# Patient Record
Sex: Female | Born: 1935
Health system: Southern US, Community
[De-identification: ages and names within clinical notes are randomized; demographics above are authoritative.]

## PROBLEM LIST (undated history)

## (undated) DIAGNOSIS — E1121 Type 2 diabetes mellitus with diabetic nephropathy: Secondary | ICD-10-CM

## (undated) DIAGNOSIS — M159 Polyosteoarthritis, unspecified: Secondary | ICD-10-CM

## (undated) DIAGNOSIS — F419 Anxiety disorder, unspecified: Secondary | ICD-10-CM

## (undated) DIAGNOSIS — M545 Low back pain, unspecified: Secondary | ICD-10-CM

## (undated) DIAGNOSIS — E669 Obesity, unspecified: Secondary | ICD-10-CM

## (undated) DIAGNOSIS — F329 Major depressive disorder, single episode, unspecified: Secondary | ICD-10-CM

## (undated) DIAGNOSIS — E782 Mixed hyperlipidemia: Secondary | ICD-10-CM

## (undated) DIAGNOSIS — K219 Gastro-esophageal reflux disease without esophagitis: Secondary | ICD-10-CM

## (undated) DIAGNOSIS — N183 Chronic kidney disease, stage 3 unspecified: Secondary | ICD-10-CM

## (undated) DIAGNOSIS — M25562 Pain in left knee: Secondary | ICD-10-CM

## (undated) DIAGNOSIS — I129 Hypertensive chronic kidney disease with stage 1 through stage 4 chronic kidney disease, or unspecified chronic kidney disease: Secondary | ICD-10-CM

## (undated) HISTORY — DX: Hypertensive chronic kidney disease with stage 1 through stage 4 chronic kidney disease, or unspecified chronic kidney disease: I12.9

## (undated) HISTORY — DX: Pain in left knee: M25.562

## (undated) HISTORY — DX: Gastro-esophageal reflux disease without esophagitis: K21.9

## (undated) HISTORY — PX: CATARACT EXTRACTION: SUR2

## (undated) HISTORY — PX: KNEE SURGERY: SHX244

## (undated) HISTORY — DX: Major depressive disorder, single episode, unspecified: F32.9

## (undated) HISTORY — DX: Chronic kidney disease, stage 3 unspecified: N18.30

## (undated) HISTORY — DX: Mixed hyperlipidemia: E78.2

## (undated) HISTORY — PX: CHOLECYSTECTOMY: SHX55

## (undated) HISTORY — DX: Low back pain, unspecified: M54.50

## (undated) HISTORY — DX: Polyosteoarthritis, unspecified: M15.9

## (undated) HISTORY — DX: Obesity, unspecified: E66.9

## (undated) HISTORY — DX: Type 2 diabetes mellitus with diabetic nephropathy: E11.21

## (undated) HISTORY — PX: OTHER SURGICAL HISTORY: SHX169

## (undated) HISTORY — DX: Anxiety disorder, unspecified: F41.9

---
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
## 2000-12-18 ENCOUNTER — Encounter: Payer: Self-pay | Admitting: Podiatry

## 2000-12-18 ENCOUNTER — Ambulatory Visit (HOSPITAL_COMMUNITY): Admission: RE | Admit: 2000-12-18 | Discharge: 2000-12-18 | Payer: Self-pay | Admitting: Podiatry

## 2001-01-04 ENCOUNTER — Ambulatory Visit (HOSPITAL_COMMUNITY): Admission: RE | Admit: 2001-01-04 | Discharge: 2001-01-04 | Payer: Self-pay | Admitting: Podiatry

## 2001-01-04 ENCOUNTER — Encounter: Payer: Self-pay | Admitting: Podiatry

## 2001-02-25 ENCOUNTER — Encounter: Payer: Self-pay | Admitting: Podiatry

## 2001-03-01 ENCOUNTER — Encounter: Payer: Self-pay | Admitting: Podiatry

## 2001-03-01 ENCOUNTER — Ambulatory Visit (HOSPITAL_COMMUNITY): Admission: RE | Admit: 2001-03-01 | Discharge: 2001-03-01 | Payer: Self-pay | Admitting: Podiatry

## 2010-05-16 ENCOUNTER — Ambulatory Visit (HOSPITAL_COMMUNITY): Payer: Self-pay | Admitting: Pulmonary Disease

## 2010-05-16 ENCOUNTER — Encounter (HOSPITAL_COMMUNITY)
Admission: RE | Admit: 2010-05-16 | Discharge: 2010-06-15 | Payer: Self-pay | Source: Home / Self Care | Attending: Pulmonary Disease | Admitting: Pulmonary Disease

## 2010-05-24 ENCOUNTER — Inpatient Hospital Stay (HOSPITAL_COMMUNITY)
Admission: AD | Admit: 2010-05-24 | Discharge: 2010-05-27 | Payer: Self-pay | Source: Home / Self Care | Attending: Pulmonary Disease | Admitting: Pulmonary Disease

## 2010-08-12 LAB — DIFFERENTIAL
Basophils Absolute: 0 10*3/uL (ref 0.0–0.1)
Eosinophils Relative: 7 % — ABNORMAL HIGH (ref 0–5)
Lymphocytes Relative: 11 % — ABNORMAL LOW (ref 12–46)
Lymphs Abs: 1 10*3/uL (ref 0.7–4.0)
Monocytes Absolute: 0.8 10*3/uL (ref 0.1–1.0)
Neutro Abs: 7.2 10*3/uL (ref 1.7–7.7)

## 2010-08-12 LAB — CBC
HCT: 33.6 % — ABNORMAL LOW (ref 36.0–46.0)
Hemoglobin: 11.4 g/dL — ABNORMAL LOW (ref 12.0–15.0)
MCH: 29.9 pg (ref 26.0–34.0)
MCHC: 33.9 g/dL (ref 30.0–36.0)
MCV: 88.2 fL (ref 78.0–100.0)
Platelets: 237 10*3/uL (ref 150–400)
RBC: 3.81 MIL/uL — ABNORMAL LOW (ref 3.87–5.11)
RDW: 13.6 % (ref 11.5–15.5)
WBC: 9.6 10*3/uL (ref 4.0–10.5)

## 2010-08-12 LAB — COMPREHENSIVE METABOLIC PANEL
AST: 18 U/L (ref 0–37)
Albumin: 3.1 g/dL — ABNORMAL LOW (ref 3.5–5.2)
BUN: 29 mg/dL — ABNORMAL HIGH (ref 6–23)
Calcium: 8.8 mg/dL (ref 8.4–10.5)
Chloride: 100 mEq/L (ref 96–112)
Creatinine, Ser: 1.9 mg/dL — ABNORMAL HIGH (ref 0.4–1.2)
GFR calc Af Amer: 31 mL/min — ABNORMAL LOW (ref >60)
Total Bilirubin: 0.4 mg/dL (ref 0.3–1.2)

## 2010-08-12 LAB — URINE CULTURE
Colony Count: NO GROWTH
Culture  Setup Time: 201112242055
Culture: NO GROWTH
Special Requests: POSITIVE

## 2010-08-12 LAB — GLUCOSE, CAPILLARY
Glucose-Capillary: 104 mg/dL — ABNORMAL HIGH (ref 70–99)
Glucose-Capillary: 111 mg/dL — ABNORMAL HIGH (ref 70–99)
Glucose-Capillary: 123 mg/dL — ABNORMAL HIGH (ref 70–99)
Glucose-Capillary: 127 mg/dL — ABNORMAL HIGH (ref 70–99)
Glucose-Capillary: 131 mg/dL — ABNORMAL HIGH (ref 70–99)
Glucose-Capillary: 170 mg/dL — ABNORMAL HIGH (ref 70–99)
Glucose-Capillary: 87 mg/dL (ref 70–99)

## 2010-08-12 LAB — CULTURE, BLOOD (ROUTINE X 2): Culture: NO GROWTH

## 2012-10-18 ENCOUNTER — Encounter: Payer: Self-pay | Admitting: Family Medicine

## 2014-06-01 LAB — HM DEXA SCAN: HM Dexa Scan: NORMAL

## 2015-07-25 DIAGNOSIS — E1165 Type 2 diabetes mellitus with hyperglycemia: Secondary | ICD-10-CM | POA: Diagnosis not present

## 2015-08-23 DIAGNOSIS — E669 Obesity, unspecified: Secondary | ICD-10-CM | POA: Diagnosis not present

## 2015-08-23 DIAGNOSIS — N189 Chronic kidney disease, unspecified: Secondary | ICD-10-CM | POA: Diagnosis not present

## 2015-08-23 DIAGNOSIS — E1121 Type 2 diabetes mellitus with diabetic nephropathy: Secondary | ICD-10-CM | POA: Diagnosis not present

## 2015-08-28 DIAGNOSIS — K219 Gastro-esophageal reflux disease without esophagitis: Secondary | ICD-10-CM | POA: Diagnosis not present

## 2015-08-28 DIAGNOSIS — E1121 Type 2 diabetes mellitus with diabetic nephropathy: Secondary | ICD-10-CM | POA: Diagnosis not present

## 2015-08-28 DIAGNOSIS — I129 Hypertensive chronic kidney disease with stage 1 through stage 4 chronic kidney disease, or unspecified chronic kidney disease: Secondary | ICD-10-CM | POA: Diagnosis not present

## 2015-08-28 DIAGNOSIS — M545 Low back pain: Secondary | ICD-10-CM | POA: Diagnosis not present

## 2015-09-28 DIAGNOSIS — E559 Vitamin D deficiency, unspecified: Secondary | ICD-10-CM | POA: Diagnosis not present

## 2015-09-28 DIAGNOSIS — N183 Chronic kidney disease, stage 3 (moderate): Secondary | ICD-10-CM | POA: Diagnosis not present

## 2015-09-28 DIAGNOSIS — Z79899 Other long term (current) drug therapy: Secondary | ICD-10-CM | POA: Diagnosis not present

## 2015-09-28 DIAGNOSIS — D649 Anemia, unspecified: Secondary | ICD-10-CM | POA: Diagnosis not present

## 2015-09-28 DIAGNOSIS — I129 Hypertensive chronic kidney disease with stage 1 through stage 4 chronic kidney disease, or unspecified chronic kidney disease: Secondary | ICD-10-CM | POA: Diagnosis not present

## 2015-09-28 DIAGNOSIS — R809 Proteinuria, unspecified: Secondary | ICD-10-CM | POA: Diagnosis not present

## 2015-10-02 DIAGNOSIS — I10 Essential (primary) hypertension: Secondary | ICD-10-CM | POA: Diagnosis not present

## 2015-10-02 DIAGNOSIS — R809 Proteinuria, unspecified: Secondary | ICD-10-CM | POA: Diagnosis not present

## 2015-10-02 DIAGNOSIS — E1129 Type 2 diabetes mellitus with other diabetic kidney complication: Secondary | ICD-10-CM | POA: Diagnosis not present

## 2015-10-02 DIAGNOSIS — N184 Chronic kidney disease, stage 4 (severe): Secondary | ICD-10-CM | POA: Diagnosis not present

## 2015-11-07 DIAGNOSIS — E1165 Type 2 diabetes mellitus with hyperglycemia: Secondary | ICD-10-CM | POA: Diagnosis not present

## 2015-12-24 DIAGNOSIS — E1121 Type 2 diabetes mellitus with diabetic nephropathy: Secondary | ICD-10-CM | POA: Diagnosis not present

## 2015-12-24 DIAGNOSIS — I129 Hypertensive chronic kidney disease with stage 1 through stage 4 chronic kidney disease, or unspecified chronic kidney disease: Secondary | ICD-10-CM | POA: Diagnosis not present

## 2015-12-24 DIAGNOSIS — M545 Low back pain: Secondary | ICD-10-CM | POA: Diagnosis not present

## 2015-12-24 DIAGNOSIS — K219 Gastro-esophageal reflux disease without esophagitis: Secondary | ICD-10-CM | POA: Diagnosis not present

## 2015-12-27 ENCOUNTER — Other Ambulatory Visit (HOSPITAL_COMMUNITY): Payer: Self-pay | Admitting: Pulmonary Disease

## 2015-12-27 ENCOUNTER — Ambulatory Visit (HOSPITAL_COMMUNITY)
Admission: RE | Admit: 2015-12-27 | Discharge: 2015-12-27 | Disposition: A | Discharge status: Home / Self Care | Payer: Medicare HMO | Source: Ambulatory Visit | Attending: Pulmonary Disease | Admitting: Pulmonary Disease

## 2015-12-27 DIAGNOSIS — E1121 Type 2 diabetes mellitus with diabetic nephropathy: Secondary | ICD-10-CM | POA: Diagnosis not present

## 2015-12-27 DIAGNOSIS — I700 Atherosclerosis of aorta: Secondary | ICD-10-CM | POA: Insufficient documentation

## 2015-12-27 DIAGNOSIS — E669 Obesity, unspecified: Secondary | ICD-10-CM | POA: Diagnosis not present

## 2015-12-27 DIAGNOSIS — M545 Low back pain: Secondary | ICD-10-CM | POA: Diagnosis not present

## 2015-12-27 DIAGNOSIS — I129 Hypertensive chronic kidney disease with stage 1 through stage 4 chronic kidney disease, or unspecified chronic kidney disease: Secondary | ICD-10-CM | POA: Diagnosis not present

## 2015-12-27 DIAGNOSIS — M419 Scoliosis, unspecified: Secondary | ICD-10-CM | POA: Insufficient documentation

## 2015-12-27 DIAGNOSIS — M4316 Spondylolisthesis, lumbar region: Secondary | ICD-10-CM | POA: Diagnosis not present

## 2015-12-27 DIAGNOSIS — M47897 Other spondylosis, lumbosacral region: Secondary | ICD-10-CM | POA: Diagnosis not present

## 2016-03-14 DIAGNOSIS — Z79899 Other long term (current) drug therapy: Secondary | ICD-10-CM | POA: Diagnosis not present

## 2016-03-14 DIAGNOSIS — I129 Hypertensive chronic kidney disease with stage 1 through stage 4 chronic kidney disease, or unspecified chronic kidney disease: Secondary | ICD-10-CM | POA: Diagnosis not present

## 2016-03-14 DIAGNOSIS — D649 Anemia, unspecified: Secondary | ICD-10-CM | POA: Diagnosis not present

## 2016-03-14 DIAGNOSIS — E559 Vitamin D deficiency, unspecified: Secondary | ICD-10-CM | POA: Diagnosis not present

## 2016-03-14 DIAGNOSIS — N183 Chronic kidney disease, stage 3 (moderate): Secondary | ICD-10-CM | POA: Diagnosis not present

## 2016-03-14 DIAGNOSIS — R809 Proteinuria, unspecified: Secondary | ICD-10-CM | POA: Diagnosis not present

## 2016-03-18 DIAGNOSIS — N250 Renal osteodystrophy: Secondary | ICD-10-CM | POA: Diagnosis not present

## 2016-03-18 DIAGNOSIS — I10 Essential (primary) hypertension: Secondary | ICD-10-CM | POA: Diagnosis not present

## 2016-03-18 DIAGNOSIS — E1129 Type 2 diabetes mellitus with other diabetic kidney complication: Secondary | ICD-10-CM | POA: Diagnosis not present

## 2016-03-18 DIAGNOSIS — N184 Chronic kidney disease, stage 4 (severe): Secondary | ICD-10-CM | POA: Diagnosis not present

## 2016-03-18 DIAGNOSIS — R809 Proteinuria, unspecified: Secondary | ICD-10-CM | POA: Diagnosis not present

## 2016-03-18 DIAGNOSIS — D649 Anemia, unspecified: Secondary | ICD-10-CM | POA: Diagnosis not present

## 2016-03-27 DIAGNOSIS — I129 Hypertensive chronic kidney disease with stage 1 through stage 4 chronic kidney disease, or unspecified chronic kidney disease: Secondary | ICD-10-CM | POA: Diagnosis not present

## 2016-03-27 DIAGNOSIS — R69 Illness, unspecified: Secondary | ICD-10-CM | POA: Diagnosis not present

## 2016-03-27 DIAGNOSIS — K219 Gastro-esophageal reflux disease without esophagitis: Secondary | ICD-10-CM | POA: Diagnosis not present

## 2016-03-27 DIAGNOSIS — E782 Mixed hyperlipidemia: Secondary | ICD-10-CM | POA: Diagnosis not present

## 2016-03-27 DIAGNOSIS — E1121 Type 2 diabetes mellitus with diabetic nephropathy: Secondary | ICD-10-CM | POA: Diagnosis not present

## 2016-03-27 DIAGNOSIS — M159 Polyosteoarthritis, unspecified: Secondary | ICD-10-CM | POA: Diagnosis not present

## 2016-03-27 DIAGNOSIS — N189 Chronic kidney disease, unspecified: Secondary | ICD-10-CM | POA: Diagnosis not present

## 2016-03-27 DIAGNOSIS — M545 Low back pain: Secondary | ICD-10-CM | POA: Diagnosis not present

## 2016-03-27 DIAGNOSIS — E669 Obesity, unspecified: Secondary | ICD-10-CM | POA: Diagnosis not present

## 2016-03-31 DIAGNOSIS — Z23 Encounter for immunization: Secondary | ICD-10-CM | POA: Diagnosis not present

## 2016-03-31 DIAGNOSIS — Z0000 Encounter for general adult medical examination without abnormal findings: Secondary | ICD-10-CM | POA: Diagnosis not present

## 2016-06-27 DIAGNOSIS — E669 Obesity, unspecified: Secondary | ICD-10-CM | POA: Diagnosis not present

## 2016-06-27 DIAGNOSIS — M545 Low back pain: Secondary | ICD-10-CM | POA: Diagnosis not present

## 2016-06-27 DIAGNOSIS — E782 Mixed hyperlipidemia: Secondary | ICD-10-CM | POA: Diagnosis not present

## 2016-06-27 DIAGNOSIS — I129 Hypertensive chronic kidney disease with stage 1 through stage 4 chronic kidney disease, or unspecified chronic kidney disease: Secondary | ICD-10-CM | POA: Diagnosis not present

## 2016-06-27 DIAGNOSIS — N189 Chronic kidney disease, unspecified: Secondary | ICD-10-CM | POA: Diagnosis not present

## 2016-06-27 DIAGNOSIS — E1121 Type 2 diabetes mellitus with diabetic nephropathy: Secondary | ICD-10-CM | POA: Diagnosis not present

## 2016-06-27 DIAGNOSIS — M159 Polyosteoarthritis, unspecified: Secondary | ICD-10-CM | POA: Diagnosis not present

## 2016-06-27 DIAGNOSIS — R69 Illness, unspecified: Secondary | ICD-10-CM | POA: Diagnosis not present

## 2016-06-27 DIAGNOSIS — K219 Gastro-esophageal reflux disease without esophagitis: Secondary | ICD-10-CM | POA: Diagnosis not present

## 2016-07-01 DIAGNOSIS — I129 Hypertensive chronic kidney disease with stage 1 through stage 4 chronic kidney disease, or unspecified chronic kidney disease: Secondary | ICD-10-CM | POA: Diagnosis not present

## 2016-07-01 DIAGNOSIS — E1121 Type 2 diabetes mellitus with diabetic nephropathy: Secondary | ICD-10-CM | POA: Diagnosis not present

## 2016-07-01 DIAGNOSIS — M545 Low back pain: Secondary | ICD-10-CM | POA: Diagnosis not present

## 2016-07-01 DIAGNOSIS — N183 Chronic kidney disease, stage 3 (moderate): Secondary | ICD-10-CM | POA: Diagnosis not present

## 2016-08-27 DIAGNOSIS — I129 Hypertensive chronic kidney disease with stage 1 through stage 4 chronic kidney disease, or unspecified chronic kidney disease: Secondary | ICD-10-CM | POA: Diagnosis not present

## 2016-08-27 DIAGNOSIS — D509 Iron deficiency anemia, unspecified: Secondary | ICD-10-CM | POA: Diagnosis not present

## 2016-08-27 DIAGNOSIS — N183 Chronic kidney disease, stage 3 (moderate): Secondary | ICD-10-CM | POA: Diagnosis not present

## 2016-08-27 DIAGNOSIS — R809 Proteinuria, unspecified: Secondary | ICD-10-CM | POA: Diagnosis not present

## 2016-08-27 DIAGNOSIS — E559 Vitamin D deficiency, unspecified: Secondary | ICD-10-CM | POA: Diagnosis not present

## 2016-08-27 DIAGNOSIS — Z79899 Other long term (current) drug therapy: Secondary | ICD-10-CM | POA: Diagnosis not present

## 2016-09-16 DIAGNOSIS — N184 Chronic kidney disease, stage 4 (severe): Secondary | ICD-10-CM | POA: Diagnosis not present

## 2016-09-16 DIAGNOSIS — R809 Proteinuria, unspecified: Secondary | ICD-10-CM | POA: Diagnosis not present

## 2016-09-16 DIAGNOSIS — I10 Essential (primary) hypertension: Secondary | ICD-10-CM | POA: Diagnosis not present

## 2016-09-16 DIAGNOSIS — N250 Renal osteodystrophy: Secondary | ICD-10-CM | POA: Diagnosis not present

## 2016-09-16 DIAGNOSIS — E1129 Type 2 diabetes mellitus with other diabetic kidney complication: Secondary | ICD-10-CM | POA: Diagnosis not present

## 2016-09-16 DIAGNOSIS — D649 Anemia, unspecified: Secondary | ICD-10-CM | POA: Diagnosis not present

## 2016-09-23 DIAGNOSIS — E782 Mixed hyperlipidemia: Secondary | ICD-10-CM | POA: Diagnosis not present

## 2016-09-23 DIAGNOSIS — E1121 Type 2 diabetes mellitus with diabetic nephropathy: Secondary | ICD-10-CM | POA: Diagnosis not present

## 2016-09-23 DIAGNOSIS — K219 Gastro-esophageal reflux disease without esophagitis: Secondary | ICD-10-CM | POA: Diagnosis not present

## 2016-09-23 DIAGNOSIS — M159 Polyosteoarthritis, unspecified: Secondary | ICD-10-CM | POA: Diagnosis not present

## 2016-09-23 DIAGNOSIS — N183 Chronic kidney disease, stage 3 (moderate): Secondary | ICD-10-CM | POA: Diagnosis not present

## 2016-09-25 DIAGNOSIS — M545 Low back pain: Secondary | ICD-10-CM | POA: Diagnosis not present

## 2016-09-25 DIAGNOSIS — N183 Chronic kidney disease, stage 3 (moderate): Secondary | ICD-10-CM | POA: Diagnosis not present

## 2016-09-25 DIAGNOSIS — I129 Hypertensive chronic kidney disease with stage 1 through stage 4 chronic kidney disease, or unspecified chronic kidney disease: Secondary | ICD-10-CM | POA: Diagnosis not present

## 2016-09-25 DIAGNOSIS — E1121 Type 2 diabetes mellitus with diabetic nephropathy: Secondary | ICD-10-CM | POA: Diagnosis not present

## 2016-10-17 DIAGNOSIS — E119 Type 2 diabetes mellitus without complications: Secondary | ICD-10-CM | POA: Diagnosis not present

## 2016-12-23 DIAGNOSIS — M545 Low back pain: Secondary | ICD-10-CM | POA: Diagnosis not present

## 2016-12-23 DIAGNOSIS — N183 Chronic kidney disease, stage 3 (moderate): Secondary | ICD-10-CM | POA: Diagnosis not present

## 2016-12-23 DIAGNOSIS — I129 Hypertensive chronic kidney disease with stage 1 through stage 4 chronic kidney disease, or unspecified chronic kidney disease: Secondary | ICD-10-CM | POA: Diagnosis not present

## 2016-12-23 DIAGNOSIS — E1121 Type 2 diabetes mellitus with diabetic nephropathy: Secondary | ICD-10-CM | POA: Diagnosis not present

## 2016-12-25 DIAGNOSIS — E1121 Type 2 diabetes mellitus with diabetic nephropathy: Secondary | ICD-10-CM | POA: Diagnosis not present

## 2016-12-25 DIAGNOSIS — N183 Chronic kidney disease, stage 3 (moderate): Secondary | ICD-10-CM | POA: Diagnosis not present

## 2016-12-25 DIAGNOSIS — M545 Low back pain: Secondary | ICD-10-CM | POA: Diagnosis not present

## 2016-12-25 DIAGNOSIS — R69 Illness, unspecified: Secondary | ICD-10-CM | POA: Diagnosis not present

## 2017-01-08 ENCOUNTER — Ambulatory Visit (INDEPENDENT_AMBULATORY_CARE_PROVIDER_SITE_OTHER): Payer: Medicare HMO | Admitting: Otolaryngology

## 2017-01-08 DIAGNOSIS — H903 Sensorineural hearing loss, bilateral: Secondary | ICD-10-CM | POA: Diagnosis not present

## 2017-01-17 DIAGNOSIS — E119 Type 2 diabetes mellitus without complications: Secondary | ICD-10-CM | POA: Diagnosis not present

## 2017-01-29 DIAGNOSIS — R809 Proteinuria, unspecified: Secondary | ICD-10-CM | POA: Diagnosis not present

## 2017-01-29 DIAGNOSIS — E559 Vitamin D deficiency, unspecified: Secondary | ICD-10-CM | POA: Diagnosis not present

## 2017-01-29 DIAGNOSIS — N183 Chronic kidney disease, stage 3 (moderate): Secondary | ICD-10-CM | POA: Diagnosis not present

## 2017-01-29 DIAGNOSIS — Z79899 Other long term (current) drug therapy: Secondary | ICD-10-CM | POA: Diagnosis not present

## 2017-01-29 DIAGNOSIS — D509 Iron deficiency anemia, unspecified: Secondary | ICD-10-CM | POA: Diagnosis not present

## 2017-01-29 DIAGNOSIS — I129 Hypertensive chronic kidney disease with stage 1 through stage 4 chronic kidney disease, or unspecified chronic kidney disease: Secondary | ICD-10-CM | POA: Diagnosis not present

## 2017-02-03 DIAGNOSIS — N183 Chronic kidney disease, stage 3 (moderate): Secondary | ICD-10-CM | POA: Diagnosis not present

## 2017-02-03 DIAGNOSIS — I10 Essential (primary) hypertension: Secondary | ICD-10-CM | POA: Diagnosis not present

## 2017-02-03 DIAGNOSIS — E1129 Type 2 diabetes mellitus with other diabetic kidney complication: Secondary | ICD-10-CM | POA: Diagnosis not present

## 2017-02-03 DIAGNOSIS — R809 Proteinuria, unspecified: Secondary | ICD-10-CM | POA: Diagnosis not present

## 2017-02-25 DIAGNOSIS — M1712 Unilateral primary osteoarthritis, left knee: Secondary | ICD-10-CM | POA: Diagnosis not present

## 2017-02-25 DIAGNOSIS — M5126 Other intervertebral disc displacement, lumbar region: Secondary | ICD-10-CM | POA: Diagnosis not present

## 2017-02-25 DIAGNOSIS — M6281 Muscle weakness (generalized): Secondary | ICD-10-CM | POA: Diagnosis not present

## 2017-02-25 DIAGNOSIS — M06061 Rheumatoid arthritis without rheumatoid factor, right knee: Secondary | ICD-10-CM | POA: Diagnosis not present

## 2017-02-25 DIAGNOSIS — M545 Low back pain: Secondary | ICD-10-CM | POA: Diagnosis not present

## 2017-03-02 DIAGNOSIS — M5432 Sciatica, left side: Secondary | ICD-10-CM | POA: Diagnosis not present

## 2017-03-02 DIAGNOSIS — M545 Low back pain: Secondary | ICD-10-CM | POA: Diagnosis not present

## 2017-03-02 DIAGNOSIS — E119 Type 2 diabetes mellitus without complications: Secondary | ICD-10-CM | POA: Diagnosis not present

## 2017-03-02 DIAGNOSIS — M25552 Pain in left hip: Secondary | ICD-10-CM | POA: Diagnosis not present

## 2017-03-02 DIAGNOSIS — Z794 Long term (current) use of insulin: Secondary | ICD-10-CM | POA: Diagnosis not present

## 2017-03-02 DIAGNOSIS — M25562 Pain in left knee: Secondary | ICD-10-CM | POA: Diagnosis not present

## 2017-03-16 ENCOUNTER — Other Ambulatory Visit: Payer: Self-pay | Admitting: Pulmonary Disease

## 2017-03-16 DIAGNOSIS — M545 Low back pain: Secondary | ICD-10-CM

## 2017-03-27 DIAGNOSIS — N183 Chronic kidney disease, stage 3 (moderate): Secondary | ICD-10-CM | POA: Diagnosis not present

## 2017-03-27 DIAGNOSIS — M545 Low back pain: Secondary | ICD-10-CM | POA: Diagnosis not present

## 2017-03-27 DIAGNOSIS — I129 Hypertensive chronic kidney disease with stage 1 through stage 4 chronic kidney disease, or unspecified chronic kidney disease: Secondary | ICD-10-CM | POA: Diagnosis not present

## 2017-03-27 DIAGNOSIS — E1121 Type 2 diabetes mellitus with diabetic nephropathy: Secondary | ICD-10-CM | POA: Diagnosis not present

## 2017-03-31 ENCOUNTER — Ambulatory Visit
Admission: RE | Admit: 2017-03-31 | Discharge: 2017-03-31 | Disposition: A | Discharge status: Home / Self Care | Payer: Medicare HMO | Source: Ambulatory Visit | Attending: Pulmonary Disease | Admitting: Pulmonary Disease

## 2017-03-31 DIAGNOSIS — M545 Low back pain: Secondary | ICD-10-CM

## 2017-03-31 DIAGNOSIS — M48061 Spinal stenosis, lumbar region without neurogenic claudication: Secondary | ICD-10-CM | POA: Diagnosis not present

## 2017-04-06 ENCOUNTER — Other Ambulatory Visit: Payer: Self-pay | Admitting: Pulmonary Disease

## 2017-04-06 DIAGNOSIS — M5416 Radiculopathy, lumbar region: Secondary | ICD-10-CM

## 2017-04-06 DIAGNOSIS — Z0000 Encounter for general adult medical examination without abnormal findings: Secondary | ICD-10-CM | POA: Diagnosis not present

## 2017-04-06 DIAGNOSIS — R69 Illness, unspecified: Secondary | ICD-10-CM | POA: Diagnosis not present

## 2017-04-06 DIAGNOSIS — E782 Mixed hyperlipidemia: Secondary | ICD-10-CM | POA: Diagnosis not present

## 2017-04-06 DIAGNOSIS — E1121 Type 2 diabetes mellitus with diabetic nephropathy: Secondary | ICD-10-CM | POA: Diagnosis not present

## 2017-04-06 DIAGNOSIS — K219 Gastro-esophageal reflux disease without esophagitis: Secondary | ICD-10-CM | POA: Diagnosis not present

## 2017-04-06 DIAGNOSIS — N183 Chronic kidney disease, stage 3 (moderate): Secondary | ICD-10-CM | POA: Diagnosis not present

## 2017-04-07 DIAGNOSIS — M545 Low back pain: Secondary | ICD-10-CM | POA: Diagnosis not present

## 2017-04-07 DIAGNOSIS — R69 Illness, unspecified: Secondary | ICD-10-CM | POA: Diagnosis not present

## 2017-04-07 DIAGNOSIS — Z23 Encounter for immunization: Secondary | ICD-10-CM | POA: Diagnosis not present

## 2017-04-07 DIAGNOSIS — E1121 Type 2 diabetes mellitus with diabetic nephropathy: Secondary | ICD-10-CM | POA: Diagnosis not present

## 2017-04-07 DIAGNOSIS — M25562 Pain in left knee: Secondary | ICD-10-CM | POA: Diagnosis not present

## 2017-04-13 ENCOUNTER — Ambulatory Visit
Admission: RE | Admit: 2017-04-13 | Discharge: 2017-04-13 | Disposition: A | Discharge status: Home / Self Care | Payer: Medicare HMO | Source: Ambulatory Visit | Attending: Pulmonary Disease | Admitting: Pulmonary Disease

## 2017-04-13 DIAGNOSIS — M5416 Radiculopathy, lumbar region: Secondary | ICD-10-CM

## 2017-04-13 DIAGNOSIS — M48061 Spinal stenosis, lumbar region without neurogenic claudication: Secondary | ICD-10-CM | POA: Diagnosis not present

## 2017-04-13 MED ORDER — IOPAMIDOL (ISOVUE-M 200) INJECTION 41%
1.0000 mL | Freq: Once | INTRAMUSCULAR | Status: AC
  Administered 2017-04-13: 1 mL via EPIDURAL

## 2017-04-13 MED ORDER — METHYLPREDNISOLONE ACETATE 40 MG/ML INJ SUSP (RADIOLOG
120.0000 mg | Freq: Once | INTRAMUSCULAR | Status: AC
  Administered 2017-04-13: 120 mg via EPIDURAL

## 2017-04-13 NOTE — Progress Notes (Signed)
Pt came in c/o nausea since she has had the pain in there back and leg. MD gave her ondansetron but the pt doesn't like it. Explained she would need to talk to her physician about another medication for the nausea.

## 2017-04-13 NOTE — Discharge Instructions (Signed)

## 2017-04-18 DIAGNOSIS — E119 Type 2 diabetes mellitus without complications: Secondary | ICD-10-CM | POA: Diagnosis not present

## 2017-04-21 DIAGNOSIS — M25562 Pain in left knee: Secondary | ICD-10-CM | POA: Diagnosis not present

## 2017-04-21 DIAGNOSIS — M545 Low back pain: Secondary | ICD-10-CM | POA: Diagnosis not present

## 2017-05-22 DIAGNOSIS — M545 Low back pain: Secondary | ICD-10-CM | POA: Diagnosis not present

## 2017-05-22 DIAGNOSIS — E1121 Type 2 diabetes mellitus with diabetic nephropathy: Secondary | ICD-10-CM | POA: Diagnosis not present

## 2017-05-22 DIAGNOSIS — N183 Chronic kidney disease, stage 3 (moderate): Secondary | ICD-10-CM | POA: Diagnosis not present

## 2017-05-22 DIAGNOSIS — M159 Polyosteoarthritis, unspecified: Secondary | ICD-10-CM | POA: Diagnosis not present

## 2017-06-09 DIAGNOSIS — M5416 Radiculopathy, lumbar region: Secondary | ICD-10-CM | POA: Diagnosis not present

## 2017-06-09 DIAGNOSIS — M545 Low back pain: Secondary | ICD-10-CM | POA: Diagnosis not present

## 2017-06-09 DIAGNOSIS — R262 Difficulty in walking, not elsewhere classified: Secondary | ICD-10-CM | POA: Diagnosis not present

## 2017-07-07 DIAGNOSIS — M5416 Radiculopathy, lumbar region: Secondary | ICD-10-CM | POA: Diagnosis not present

## 2017-07-07 DIAGNOSIS — M545 Low back pain: Secondary | ICD-10-CM | POA: Diagnosis not present

## 2017-07-07 DIAGNOSIS — R262 Difficulty in walking, not elsewhere classified: Secondary | ICD-10-CM | POA: Diagnosis not present

## 2017-07-09 DIAGNOSIS — M545 Low back pain: Secondary | ICD-10-CM | POA: Diagnosis not present

## 2017-07-09 DIAGNOSIS — M5416 Radiculopathy, lumbar region: Secondary | ICD-10-CM | POA: Diagnosis not present

## 2017-07-09 DIAGNOSIS — R262 Difficulty in walking, not elsewhere classified: Secondary | ICD-10-CM | POA: Diagnosis not present

## 2017-07-16 DIAGNOSIS — R262 Difficulty in walking, not elsewhere classified: Secondary | ICD-10-CM | POA: Diagnosis not present

## 2017-07-16 DIAGNOSIS — E870 Hyperosmolality and hypernatremia: Secondary | ICD-10-CM | POA: Diagnosis not present

## 2017-07-16 DIAGNOSIS — E559 Vitamin D deficiency, unspecified: Secondary | ICD-10-CM | POA: Diagnosis not present

## 2017-07-16 DIAGNOSIS — R809 Proteinuria, unspecified: Secondary | ICD-10-CM | POA: Diagnosis not present

## 2017-07-16 DIAGNOSIS — M545 Low back pain: Secondary | ICD-10-CM | POA: Diagnosis not present

## 2017-07-16 DIAGNOSIS — M5416 Radiculopathy, lumbar region: Secondary | ICD-10-CM | POA: Diagnosis not present

## 2017-07-16 DIAGNOSIS — N183 Chronic kidney disease, stage 3 (moderate): Secondary | ICD-10-CM | POA: Diagnosis not present

## 2017-07-16 DIAGNOSIS — I129 Hypertensive chronic kidney disease with stage 1 through stage 4 chronic kidney disease, or unspecified chronic kidney disease: Secondary | ICD-10-CM | POA: Diagnosis not present

## 2017-07-16 DIAGNOSIS — Z79899 Other long term (current) drug therapy: Secondary | ICD-10-CM | POA: Diagnosis not present

## 2017-07-16 DIAGNOSIS — D509 Iron deficiency anemia, unspecified: Secondary | ICD-10-CM | POA: Diagnosis not present

## 2017-07-18 DIAGNOSIS — E119 Type 2 diabetes mellitus without complications: Secondary | ICD-10-CM | POA: Diagnosis not present

## 2017-07-21 DIAGNOSIS — I129 Hypertensive chronic kidney disease with stage 1 through stage 4 chronic kidney disease, or unspecified chronic kidney disease: Secondary | ICD-10-CM | POA: Diagnosis not present

## 2017-07-21 DIAGNOSIS — E889 Metabolic disorder, unspecified: Secondary | ICD-10-CM | POA: Diagnosis not present

## 2017-07-21 DIAGNOSIS — K219 Gastro-esophageal reflux disease without esophagitis: Secondary | ICD-10-CM | POA: Diagnosis not present

## 2017-07-21 DIAGNOSIS — R69 Illness, unspecified: Secondary | ICD-10-CM | POA: Diagnosis not present

## 2017-07-21 DIAGNOSIS — M25562 Pain in left knee: Secondary | ICD-10-CM | POA: Diagnosis not present

## 2017-07-21 DIAGNOSIS — M545 Low back pain: Secondary | ICD-10-CM | POA: Diagnosis not present

## 2017-07-21 DIAGNOSIS — I10 Essential (primary) hypertension: Secondary | ICD-10-CM | POA: Diagnosis not present

## 2017-07-21 DIAGNOSIS — E669 Obesity, unspecified: Secondary | ICD-10-CM | POA: Diagnosis not present

## 2017-07-21 DIAGNOSIS — E1121 Type 2 diabetes mellitus with diabetic nephropathy: Secondary | ICD-10-CM | POA: Diagnosis not present

## 2017-07-21 DIAGNOSIS — M5416 Radiculopathy, lumbar region: Secondary | ICD-10-CM | POA: Diagnosis not present

## 2017-07-21 DIAGNOSIS — E782 Mixed hyperlipidemia: Secondary | ICD-10-CM | POA: Diagnosis not present

## 2017-07-21 DIAGNOSIS — N183 Chronic kidney disease, stage 3 (moderate): Secondary | ICD-10-CM | POA: Diagnosis not present

## 2017-07-21 DIAGNOSIS — R262 Difficulty in walking, not elsewhere classified: Secondary | ICD-10-CM | POA: Diagnosis not present

## 2017-07-21 DIAGNOSIS — M9080 Osteopathy in diseases classified elsewhere, unspecified site: Secondary | ICD-10-CM | POA: Diagnosis not present

## 2017-07-23 DIAGNOSIS — M545 Low back pain: Secondary | ICD-10-CM | POA: Diagnosis not present

## 2017-07-23 DIAGNOSIS — R262 Difficulty in walking, not elsewhere classified: Secondary | ICD-10-CM | POA: Diagnosis not present

## 2017-07-23 DIAGNOSIS — M5416 Radiculopathy, lumbar region: Secondary | ICD-10-CM | POA: Diagnosis not present

## 2017-07-24 DIAGNOSIS — E1121 Type 2 diabetes mellitus with diabetic nephropathy: Secondary | ICD-10-CM | POA: Diagnosis not present

## 2017-07-24 DIAGNOSIS — N184 Chronic kidney disease, stage 4 (severe): Secondary | ICD-10-CM | POA: Diagnosis not present

## 2017-07-24 DIAGNOSIS — Z23 Encounter for immunization: Secondary | ICD-10-CM | POA: Diagnosis not present

## 2017-07-24 DIAGNOSIS — I129 Hypertensive chronic kidney disease with stage 1 through stage 4 chronic kidney disease, or unspecified chronic kidney disease: Secondary | ICD-10-CM | POA: Diagnosis not present

## 2017-07-24 DIAGNOSIS — M545 Low back pain: Secondary | ICD-10-CM | POA: Diagnosis not present

## 2017-07-28 DIAGNOSIS — M5416 Radiculopathy, lumbar region: Secondary | ICD-10-CM | POA: Diagnosis not present

## 2017-07-28 DIAGNOSIS — R262 Difficulty in walking, not elsewhere classified: Secondary | ICD-10-CM | POA: Diagnosis not present

## 2017-07-28 DIAGNOSIS — M545 Low back pain: Secondary | ICD-10-CM | POA: Diagnosis not present

## 2017-07-30 DIAGNOSIS — M5416 Radiculopathy, lumbar region: Secondary | ICD-10-CM | POA: Diagnosis not present

## 2017-07-30 DIAGNOSIS — M545 Low back pain: Secondary | ICD-10-CM | POA: Diagnosis not present

## 2017-07-30 DIAGNOSIS — R262 Difficulty in walking, not elsewhere classified: Secondary | ICD-10-CM | POA: Diagnosis not present

## 2017-08-04 DIAGNOSIS — M545 Low back pain: Secondary | ICD-10-CM | POA: Diagnosis not present

## 2017-08-04 DIAGNOSIS — R262 Difficulty in walking, not elsewhere classified: Secondary | ICD-10-CM | POA: Diagnosis not present

## 2017-08-04 DIAGNOSIS — M5416 Radiculopathy, lumbar region: Secondary | ICD-10-CM | POA: Diagnosis not present

## 2017-08-06 DIAGNOSIS — R262 Difficulty in walking, not elsewhere classified: Secondary | ICD-10-CM | POA: Diagnosis not present

## 2017-08-06 DIAGNOSIS — M545 Low back pain: Secondary | ICD-10-CM | POA: Diagnosis not present

## 2017-08-06 DIAGNOSIS — M5416 Radiculopathy, lumbar region: Secondary | ICD-10-CM | POA: Diagnosis not present

## 2017-08-11 DIAGNOSIS — M545 Low back pain: Secondary | ICD-10-CM | POA: Diagnosis not present

## 2017-08-11 DIAGNOSIS — R262 Difficulty in walking, not elsewhere classified: Secondary | ICD-10-CM | POA: Diagnosis not present

## 2017-08-11 DIAGNOSIS — M5416 Radiculopathy, lumbar region: Secondary | ICD-10-CM | POA: Diagnosis not present

## 2017-08-13 DIAGNOSIS — M5416 Radiculopathy, lumbar region: Secondary | ICD-10-CM | POA: Diagnosis not present

## 2017-08-13 DIAGNOSIS — R262 Difficulty in walking, not elsewhere classified: Secondary | ICD-10-CM | POA: Diagnosis not present

## 2017-08-13 DIAGNOSIS — M545 Low back pain: Secondary | ICD-10-CM | POA: Diagnosis not present

## 2017-08-18 DIAGNOSIS — M5416 Radiculopathy, lumbar region: Secondary | ICD-10-CM | POA: Diagnosis not present

## 2017-08-18 DIAGNOSIS — R262 Difficulty in walking, not elsewhere classified: Secondary | ICD-10-CM | POA: Diagnosis not present

## 2017-08-18 DIAGNOSIS — M545 Low back pain: Secondary | ICD-10-CM | POA: Diagnosis not present

## 2017-08-20 DIAGNOSIS — M5416 Radiculopathy, lumbar region: Secondary | ICD-10-CM | POA: Diagnosis not present

## 2017-08-20 DIAGNOSIS — M545 Low back pain: Secondary | ICD-10-CM | POA: Diagnosis not present

## 2017-08-20 DIAGNOSIS — R262 Difficulty in walking, not elsewhere classified: Secondary | ICD-10-CM | POA: Diagnosis not present

## 2017-08-25 DIAGNOSIS — M545 Low back pain: Secondary | ICD-10-CM | POA: Diagnosis not present

## 2017-08-25 DIAGNOSIS — M5416 Radiculopathy, lumbar region: Secondary | ICD-10-CM | POA: Diagnosis not present

## 2017-08-25 DIAGNOSIS — R262 Difficulty in walking, not elsewhere classified: Secondary | ICD-10-CM | POA: Diagnosis not present

## 2017-08-27 DIAGNOSIS — M545 Low back pain: Secondary | ICD-10-CM | POA: Diagnosis not present

## 2017-08-27 DIAGNOSIS — M5416 Radiculopathy, lumbar region: Secondary | ICD-10-CM | POA: Diagnosis not present

## 2017-08-27 DIAGNOSIS — R262 Difficulty in walking, not elsewhere classified: Secondary | ICD-10-CM | POA: Diagnosis not present

## 2017-09-01 DIAGNOSIS — M545 Low back pain: Secondary | ICD-10-CM | POA: Diagnosis not present

## 2017-09-01 DIAGNOSIS — R262 Difficulty in walking, not elsewhere classified: Secondary | ICD-10-CM | POA: Diagnosis not present

## 2017-09-01 DIAGNOSIS — M5416 Radiculopathy, lumbar region: Secondary | ICD-10-CM | POA: Diagnosis not present

## 2017-10-17 DIAGNOSIS — E119 Type 2 diabetes mellitus without complications: Secondary | ICD-10-CM | POA: Diagnosis not present

## 2017-10-19 DIAGNOSIS — I129 Hypertensive chronic kidney disease with stage 1 through stage 4 chronic kidney disease, or unspecified chronic kidney disease: Secondary | ICD-10-CM | POA: Diagnosis not present

## 2017-10-19 DIAGNOSIS — E1121 Type 2 diabetes mellitus with diabetic nephropathy: Secondary | ICD-10-CM | POA: Diagnosis not present

## 2017-10-19 DIAGNOSIS — M545 Low back pain: Secondary | ICD-10-CM | POA: Diagnosis not present

## 2017-10-19 DIAGNOSIS — N184 Chronic kidney disease, stage 4 (severe): Secondary | ICD-10-CM | POA: Diagnosis not present

## 2017-10-19 LAB — MICROALBUMIN, URINE: Microalb, Ur: 102.2

## 2017-11-27 DIAGNOSIS — E559 Vitamin D deficiency, unspecified: Secondary | ICD-10-CM | POA: Diagnosis not present

## 2017-11-27 DIAGNOSIS — I10 Essential (primary) hypertension: Secondary | ICD-10-CM | POA: Diagnosis not present

## 2017-11-27 DIAGNOSIS — R809 Proteinuria, unspecified: Secondary | ICD-10-CM | POA: Diagnosis not present

## 2017-11-27 DIAGNOSIS — D509 Iron deficiency anemia, unspecified: Secondary | ICD-10-CM | POA: Diagnosis not present

## 2017-11-27 DIAGNOSIS — N183 Chronic kidney disease, stage 3 (moderate): Secondary | ICD-10-CM | POA: Diagnosis not present

## 2017-11-27 DIAGNOSIS — Z79899 Other long term (current) drug therapy: Secondary | ICD-10-CM | POA: Diagnosis not present

## 2017-12-01 DIAGNOSIS — D649 Anemia, unspecified: Secondary | ICD-10-CM | POA: Diagnosis not present

## 2017-12-01 DIAGNOSIS — N183 Chronic kidney disease, stage 3 (moderate): Secondary | ICD-10-CM | POA: Diagnosis not present

## 2017-12-01 DIAGNOSIS — E1121 Type 2 diabetes mellitus with diabetic nephropathy: Secondary | ICD-10-CM | POA: Diagnosis not present

## 2017-12-01 DIAGNOSIS — I10 Essential (primary) hypertension: Secondary | ICD-10-CM | POA: Diagnosis not present

## 2017-12-02 DIAGNOSIS — E1121 Type 2 diabetes mellitus with diabetic nephropathy: Secondary | ICD-10-CM | POA: Diagnosis not present

## 2017-12-02 DIAGNOSIS — Z0000 Encounter for general adult medical examination without abnormal findings: Secondary | ICD-10-CM | POA: Diagnosis not present

## 2017-12-02 LAB — HM DIABETES FOOT EXAM

## 2017-12-10 DIAGNOSIS — H26492 Other secondary cataract, left eye: Secondary | ICD-10-CM | POA: Diagnosis not present

## 2017-12-10 DIAGNOSIS — H4322 Crystalline deposits in vitreous body, left eye: Secondary | ICD-10-CM | POA: Diagnosis not present

## 2017-12-10 DIAGNOSIS — H02403 Unspecified ptosis of bilateral eyelids: Secondary | ICD-10-CM | POA: Diagnosis not present

## 2017-12-10 DIAGNOSIS — Z961 Presence of intraocular lens: Secondary | ICD-10-CM | POA: Diagnosis not present

## 2017-12-10 DIAGNOSIS — H26491 Other secondary cataract, right eye: Secondary | ICD-10-CM | POA: Diagnosis not present

## 2017-12-10 DIAGNOSIS — H31003 Unspecified chorioretinal scars, bilateral: Secondary | ICD-10-CM | POA: Diagnosis not present

## 2018-01-16 DIAGNOSIS — E119 Type 2 diabetes mellitus without complications: Secondary | ICD-10-CM | POA: Diagnosis not present

## 2018-01-29 DIAGNOSIS — J200 Acute bronchitis due to Mycoplasma pneumoniae: Secondary | ICD-10-CM | POA: Diagnosis not present

## 2018-01-29 DIAGNOSIS — K625 Hemorrhage of anus and rectum: Secondary | ICD-10-CM | POA: Diagnosis not present

## 2018-01-29 DIAGNOSIS — E1165 Type 2 diabetes mellitus with hyperglycemia: Secondary | ICD-10-CM | POA: Diagnosis not present

## 2018-01-29 DIAGNOSIS — C960 Multifocal and multisystemic (disseminated) Langerhans-cell histiocytosis: Secondary | ICD-10-CM | POA: Diagnosis not present

## 2018-01-29 DIAGNOSIS — A09 Infectious gastroenteritis and colitis, unspecified: Secondary | ICD-10-CM | POA: Diagnosis not present

## 2018-02-11 DIAGNOSIS — Z9841 Cataract extraction status, right eye: Secondary | ICD-10-CM | POA: Diagnosis not present

## 2018-02-11 DIAGNOSIS — H26492 Other secondary cataract, left eye: Secondary | ICD-10-CM | POA: Diagnosis not present

## 2018-02-11 DIAGNOSIS — H2511 Age-related nuclear cataract, right eye: Secondary | ICD-10-CM | POA: Diagnosis not present

## 2018-02-11 DIAGNOSIS — H02403 Unspecified ptosis of bilateral eyelids: Secondary | ICD-10-CM | POA: Diagnosis not present

## 2018-02-11 DIAGNOSIS — H31003 Unspecified chorioretinal scars, bilateral: Secondary | ICD-10-CM | POA: Diagnosis not present

## 2018-02-11 DIAGNOSIS — H4322 Crystalline deposits in vitreous body, left eye: Secondary | ICD-10-CM | POA: Diagnosis not present

## 2018-02-11 DIAGNOSIS — H26491 Other secondary cataract, right eye: Secondary | ICD-10-CM | POA: Diagnosis not present

## 2018-03-09 DIAGNOSIS — M545 Low back pain: Secondary | ICD-10-CM | POA: Diagnosis not present

## 2018-03-09 DIAGNOSIS — I129 Hypertensive chronic kidney disease with stage 1 through stage 4 chronic kidney disease, or unspecified chronic kidney disease: Secondary | ICD-10-CM | POA: Diagnosis not present

## 2018-03-09 DIAGNOSIS — E669 Obesity, unspecified: Secondary | ICD-10-CM | POA: Diagnosis not present

## 2018-03-09 DIAGNOSIS — K219 Gastro-esophageal reflux disease without esophagitis: Secondary | ICD-10-CM | POA: Diagnosis not present

## 2018-03-09 DIAGNOSIS — R69 Illness, unspecified: Secondary | ICD-10-CM | POA: Diagnosis not present

## 2018-03-09 DIAGNOSIS — E1121 Type 2 diabetes mellitus with diabetic nephropathy: Secondary | ICD-10-CM | POA: Diagnosis not present

## 2018-03-09 DIAGNOSIS — M25562 Pain in left knee: Secondary | ICD-10-CM | POA: Diagnosis not present

## 2018-03-09 DIAGNOSIS — M159 Polyosteoarthritis, unspecified: Secondary | ICD-10-CM | POA: Diagnosis not present

## 2018-03-09 DIAGNOSIS — E782 Mixed hyperlipidemia: Secondary | ICD-10-CM | POA: Diagnosis not present

## 2018-03-10 DIAGNOSIS — Z23 Encounter for immunization: Secondary | ICD-10-CM | POA: Diagnosis not present

## 2018-03-10 DIAGNOSIS — E1121 Type 2 diabetes mellitus with diabetic nephropathy: Secondary | ICD-10-CM | POA: Diagnosis not present

## 2018-03-10 DIAGNOSIS — M545 Low back pain: Secondary | ICD-10-CM | POA: Diagnosis not present

## 2018-03-10 DIAGNOSIS — I129 Hypertensive chronic kidney disease with stage 1 through stage 4 chronic kidney disease, or unspecified chronic kidney disease: Secondary | ICD-10-CM | POA: Diagnosis not present

## 2018-03-10 DIAGNOSIS — R69 Illness, unspecified: Secondary | ICD-10-CM | POA: Diagnosis not present

## 2018-04-02 DIAGNOSIS — R809 Proteinuria, unspecified: Secondary | ICD-10-CM | POA: Diagnosis not present

## 2018-04-02 DIAGNOSIS — I10 Essential (primary) hypertension: Secondary | ICD-10-CM | POA: Diagnosis not present

## 2018-04-02 DIAGNOSIS — E559 Vitamin D deficiency, unspecified: Secondary | ICD-10-CM | POA: Diagnosis not present

## 2018-04-02 DIAGNOSIS — Z79899 Other long term (current) drug therapy: Secondary | ICD-10-CM | POA: Diagnosis not present

## 2018-04-02 DIAGNOSIS — N183 Chronic kidney disease, stage 3 (moderate): Secondary | ICD-10-CM | POA: Diagnosis not present

## 2018-04-02 DIAGNOSIS — D509 Iron deficiency anemia, unspecified: Secondary | ICD-10-CM | POA: Diagnosis not present

## 2018-04-06 DIAGNOSIS — N183 Chronic kidney disease, stage 3 (moderate): Secondary | ICD-10-CM | POA: Diagnosis not present

## 2018-04-06 DIAGNOSIS — E559 Vitamin D deficiency, unspecified: Secondary | ICD-10-CM | POA: Diagnosis not present

## 2018-04-06 DIAGNOSIS — D649 Anemia, unspecified: Secondary | ICD-10-CM | POA: Diagnosis not present

## 2018-04-06 DIAGNOSIS — E876 Hypokalemia: Secondary | ICD-10-CM | POA: Diagnosis not present

## 2018-04-06 DIAGNOSIS — E1129 Type 2 diabetes mellitus with other diabetic kidney complication: Secondary | ICD-10-CM | POA: Diagnosis not present

## 2018-04-06 DIAGNOSIS — I509 Heart failure, unspecified: Secondary | ICD-10-CM | POA: Diagnosis not present

## 2018-04-06 DIAGNOSIS — I10 Essential (primary) hypertension: Secondary | ICD-10-CM | POA: Diagnosis not present

## 2018-04-17 DIAGNOSIS — E119 Type 2 diabetes mellitus without complications: Secondary | ICD-10-CM | POA: Diagnosis not present

## 2018-06-09 ENCOUNTER — Encounter: Payer: Self-pay | Admitting: Pulmonary Disease

## 2018-06-09 DIAGNOSIS — R69 Illness, unspecified: Secondary | ICD-10-CM | POA: Diagnosis not present

## 2018-06-09 DIAGNOSIS — E782 Mixed hyperlipidemia: Secondary | ICD-10-CM | POA: Diagnosis not present

## 2018-06-09 DIAGNOSIS — M545 Low back pain: Secondary | ICD-10-CM | POA: Diagnosis not present

## 2018-06-09 DIAGNOSIS — M25562 Pain in left knee: Secondary | ICD-10-CM | POA: Diagnosis not present

## 2018-06-09 DIAGNOSIS — E669 Obesity, unspecified: Secondary | ICD-10-CM | POA: Diagnosis not present

## 2018-06-09 DIAGNOSIS — K219 Gastro-esophageal reflux disease without esophagitis: Secondary | ICD-10-CM | POA: Diagnosis not present

## 2018-06-09 DIAGNOSIS — N183 Chronic kidney disease, stage 3 (moderate): Secondary | ICD-10-CM | POA: Diagnosis not present

## 2018-06-09 DIAGNOSIS — I129 Hypertensive chronic kidney disease with stage 1 through stage 4 chronic kidney disease, or unspecified chronic kidney disease: Secondary | ICD-10-CM | POA: Diagnosis not present

## 2018-06-09 DIAGNOSIS — E1121 Type 2 diabetes mellitus with diabetic nephropathy: Secondary | ICD-10-CM | POA: Diagnosis not present

## 2018-06-10 DIAGNOSIS — Z23 Encounter for immunization: Secondary | ICD-10-CM | POA: Diagnosis not present

## 2018-06-10 DIAGNOSIS — N183 Chronic kidney disease, stage 3 (moderate): Secondary | ICD-10-CM | POA: Diagnosis not present

## 2018-06-10 DIAGNOSIS — E1121 Type 2 diabetes mellitus with diabetic nephropathy: Secondary | ICD-10-CM | POA: Diagnosis not present

## 2018-06-10 DIAGNOSIS — M545 Low back pain: Secondary | ICD-10-CM | POA: Diagnosis not present

## 2018-06-10 DIAGNOSIS — I129 Hypertensive chronic kidney disease with stage 1 through stage 4 chronic kidney disease, or unspecified chronic kidney disease: Secondary | ICD-10-CM | POA: Diagnosis not present

## 2018-06-14 IMAGING — XA Imaging study
2 series · 2 of 2 positions shown · non-contrast
Comparison: none

CLINICAL DATA: Lumbosacral spondylosis without myelopathy. Left
greater than right low back pain. Left lower extremity radiculopathy
with pain in the anterior and lateral thigh and knee. Multilevel
lumbar spinal stenosis, most severe at L3-4 and L4-5.

[Series 1: ortho standard · 1 of 1 slices shown (1 of 2)]
[im 1/1]
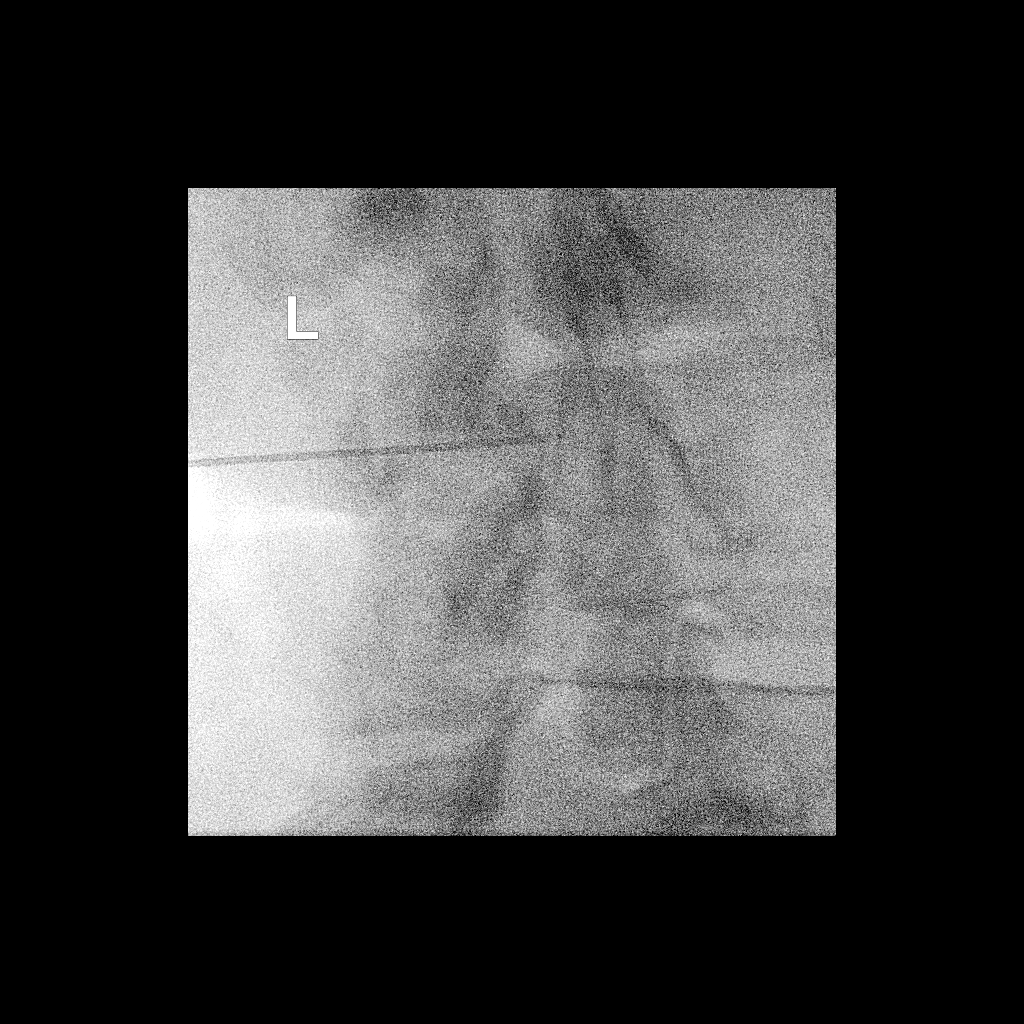

[Series 2: ortho standard · 1 of 1 slices shown (2 of 2)]
[im 1/1]
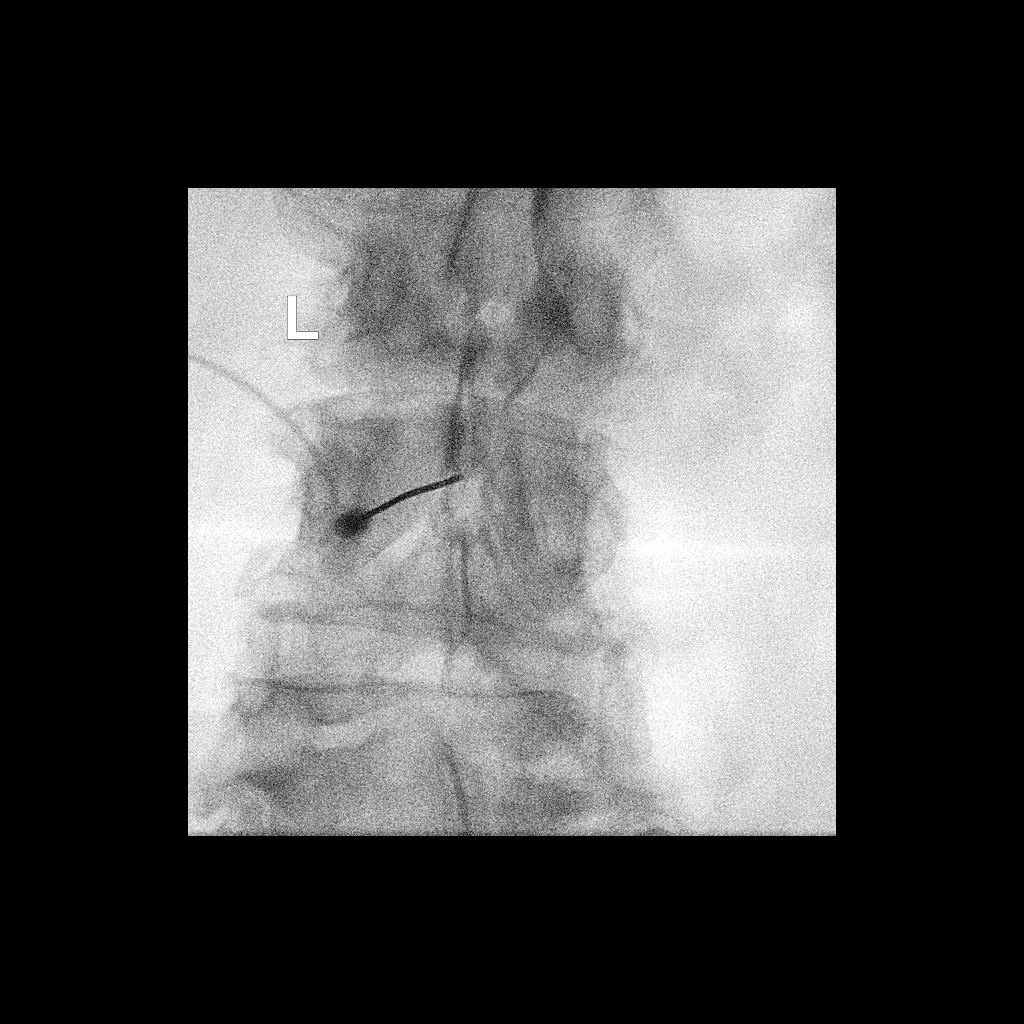

[2 of 2 positions shown; findings below may reference images not displayed]

FLUOROSCOPY TIME:  Radiation Exposure Index (as provided by the
fluoroscopic device): 29.81 microGray*m^2

Fluoroscopy Time (in minutes and seconds):  11 seconds

PROCEDURE:
The procedure, risks, benefits, and alternatives were explained to
the patient. Questions regarding the procedure were encouraged and
answered. The patient understands and consents to the procedure.

LUMBAR EPIDURAL INJECTION:

An interlaminar approach was performed on the left at L3-4. The
overlying skin was cleansed and anesthetized. A 3.5 inch 20 gauge
epidural needle was advanced using loss-of-resistance technique.

DIAGNOSTIC EPIDURAL INJECTION:

Injection of Isovue-M 200 shows a good epidural pattern with spread
above and below the level of needle placement bilaterally. No
vascular opacification is seen.

THERAPEUTIC EPIDURAL INJECTION:

120 mg of Depo-Medrol mixed with 3 mL of 1% lidocaine were
instilled. The procedure was well-tolerated, and the patient was
discharged thirty minutes following the injection in good condition.

COMPLICATIONS:
None
IMPRESSION: Technically successful lumbar interlaminar epidural injection on the
left at L3-4.

## 2018-07-17 DIAGNOSIS — E119 Type 2 diabetes mellitus without complications: Secondary | ICD-10-CM | POA: Diagnosis not present

## 2018-08-13 DIAGNOSIS — R809 Proteinuria, unspecified: Secondary | ICD-10-CM | POA: Diagnosis not present

## 2018-08-13 DIAGNOSIS — E559 Vitamin D deficiency, unspecified: Secondary | ICD-10-CM | POA: Diagnosis not present

## 2018-08-13 DIAGNOSIS — I10 Essential (primary) hypertension: Secondary | ICD-10-CM | POA: Diagnosis not present

## 2018-08-13 DIAGNOSIS — N183 Chronic kidney disease, stage 3 (moderate): Secondary | ICD-10-CM | POA: Diagnosis not present

## 2018-08-13 DIAGNOSIS — Z79899 Other long term (current) drug therapy: Secondary | ICD-10-CM | POA: Diagnosis not present

## 2018-08-13 DIAGNOSIS — D649 Anemia, unspecified: Secondary | ICD-10-CM | POA: Diagnosis not present

## 2018-10-16 DIAGNOSIS — E119 Type 2 diabetes mellitus without complications: Secondary | ICD-10-CM | POA: Diagnosis not present

## 2018-11-22 DIAGNOSIS — I129 Hypertensive chronic kidney disease with stage 1 through stage 4 chronic kidney disease, or unspecified chronic kidney disease: Secondary | ICD-10-CM | POA: Diagnosis not present

## 2018-11-22 DIAGNOSIS — R69 Illness, unspecified: Secondary | ICD-10-CM | POA: Diagnosis not present

## 2018-11-22 DIAGNOSIS — E1121 Type 2 diabetes mellitus with diabetic nephropathy: Secondary | ICD-10-CM | POA: Diagnosis not present

## 2018-11-22 DIAGNOSIS — M545 Low back pain: Secondary | ICD-10-CM | POA: Diagnosis not present

## 2018-12-28 DIAGNOSIS — Z79899 Other long term (current) drug therapy: Secondary | ICD-10-CM | POA: Diagnosis not present

## 2018-12-28 DIAGNOSIS — R809 Proteinuria, unspecified: Secondary | ICD-10-CM | POA: Diagnosis not present

## 2018-12-28 DIAGNOSIS — D649 Anemia, unspecified: Secondary | ICD-10-CM | POA: Diagnosis not present

## 2018-12-28 DIAGNOSIS — E1129 Type 2 diabetes mellitus with other diabetic kidney complication: Secondary | ICD-10-CM | POA: Diagnosis not present

## 2018-12-28 DIAGNOSIS — I129 Hypertensive chronic kidney disease with stage 1 through stage 4 chronic kidney disease, or unspecified chronic kidney disease: Secondary | ICD-10-CM | POA: Diagnosis not present

## 2018-12-28 DIAGNOSIS — N184 Chronic kidney disease, stage 4 (severe): Secondary | ICD-10-CM | POA: Diagnosis not present

## 2018-12-28 DIAGNOSIS — E559 Vitamin D deficiency, unspecified: Secondary | ICD-10-CM | POA: Diagnosis not present

## 2019-01-15 DIAGNOSIS — E119 Type 2 diabetes mellitus without complications: Secondary | ICD-10-CM | POA: Diagnosis not present

## 2019-01-20 DIAGNOSIS — E1121 Type 2 diabetes mellitus with diabetic nephropathy: Secondary | ICD-10-CM | POA: Diagnosis not present

## 2019-01-20 DIAGNOSIS — E876 Hypokalemia: Secondary | ICD-10-CM | POA: Diagnosis not present

## 2019-01-20 DIAGNOSIS — I10 Essential (primary) hypertension: Secondary | ICD-10-CM | POA: Diagnosis not present

## 2019-01-20 DIAGNOSIS — N184 Chronic kidney disease, stage 4 (severe): Secondary | ICD-10-CM | POA: Diagnosis not present

## 2019-02-24 DIAGNOSIS — M545 Low back pain: Secondary | ICD-10-CM | POA: Diagnosis not present

## 2019-02-24 DIAGNOSIS — E1121 Type 2 diabetes mellitus with diabetic nephropathy: Secondary | ICD-10-CM | POA: Diagnosis not present

## 2019-02-24 DIAGNOSIS — E669 Obesity, unspecified: Secondary | ICD-10-CM | POA: Diagnosis not present

## 2019-02-24 DIAGNOSIS — I129 Hypertensive chronic kidney disease with stage 1 through stage 4 chronic kidney disease, or unspecified chronic kidney disease: Secondary | ICD-10-CM | POA: Diagnosis not present

## 2019-02-24 DIAGNOSIS — M25562 Pain in left knee: Secondary | ICD-10-CM | POA: Diagnosis not present

## 2019-02-24 DIAGNOSIS — R69 Illness, unspecified: Secondary | ICD-10-CM | POA: Diagnosis not present

## 2019-02-24 DIAGNOSIS — N183 Chronic kidney disease, stage 3 (moderate): Secondary | ICD-10-CM | POA: Diagnosis not present

## 2019-02-24 DIAGNOSIS — K219 Gastro-esophageal reflux disease without esophagitis: Secondary | ICD-10-CM | POA: Diagnosis not present

## 2019-02-24 DIAGNOSIS — E782 Mixed hyperlipidemia: Secondary | ICD-10-CM | POA: Diagnosis not present

## 2019-02-24 LAB — BASIC METABOLIC PANEL
BUN: 17 (ref 4–21)
CO2: 30 — AB (ref 13–22)
Chloride: 102 (ref 99–108)
Creatinine: 1.4 — AB (ref <=1.1)
Glucose: 148
Potassium: 4.3 (ref 3.4–5.3)
Sodium: 140 (ref 137–147)

## 2019-02-24 LAB — CBC AND DIFFERENTIAL
HCT: 36 (ref 36–46)
Hemoglobin: 12.2 (ref 12.0–16.0)
Platelets: 296 (ref 150–399)
WBC: 10.1

## 2019-02-24 LAB — COMPREHENSIVE METABOLIC PANEL
Albumin: 3.9 (ref 3.5–5.0)
Calcium: 9.3 (ref 8.7–10.7)
GFR calc Af Amer: 39
GFR calc non Af Amer: 34
Globulin: 2.8

## 2019-02-24 LAB — HEMOGLOBIN A1C: Hemoglobin A1C: 8

## 2019-02-24 LAB — LIPID PANEL
Cholesterol: 237 — AB (ref 0–200)
HDL: 42 (ref 35–70)
LDL Cholesterol: 140
Triglycerides: 355 — AB (ref 40–160)

## 2019-02-24 LAB — TSH: TSH: 4.31 (ref <=5.90)

## 2019-02-24 LAB — CBC: RBC: 4.22 (ref 3.87–5.11)

## 2019-02-25 DIAGNOSIS — Z23 Encounter for immunization: Secondary | ICD-10-CM | POA: Diagnosis not present

## 2019-02-25 DIAGNOSIS — N183 Chronic kidney disease, stage 3 (moderate): Secondary | ICD-10-CM | POA: Diagnosis not present

## 2019-02-25 DIAGNOSIS — E1121 Type 2 diabetes mellitus with diabetic nephropathy: Secondary | ICD-10-CM | POA: Diagnosis not present

## 2019-02-25 DIAGNOSIS — M159 Polyosteoarthritis, unspecified: Secondary | ICD-10-CM | POA: Diagnosis not present

## 2019-02-25 DIAGNOSIS — I129 Hypertensive chronic kidney disease with stage 1 through stage 4 chronic kidney disease, or unspecified chronic kidney disease: Secondary | ICD-10-CM | POA: Diagnosis not present

## 2019-03-31 DIAGNOSIS — E211 Secondary hyperparathyroidism, not elsewhere classified: Secondary | ICD-10-CM | POA: Diagnosis not present

## 2019-03-31 DIAGNOSIS — E1122 Type 2 diabetes mellitus with diabetic chronic kidney disease: Secondary | ICD-10-CM | POA: Diagnosis not present

## 2019-03-31 DIAGNOSIS — I129 Hypertensive chronic kidney disease with stage 1 through stage 4 chronic kidney disease, or unspecified chronic kidney disease: Secondary | ICD-10-CM | POA: Diagnosis not present

## 2019-03-31 DIAGNOSIS — E559 Vitamin D deficiency, unspecified: Secondary | ICD-10-CM | POA: Diagnosis not present

## 2019-03-31 DIAGNOSIS — N189 Chronic kidney disease, unspecified: Secondary | ICD-10-CM | POA: Diagnosis not present

## 2019-04-16 DIAGNOSIS — E119 Type 2 diabetes mellitus without complications: Secondary | ICD-10-CM | POA: Diagnosis not present

## 2019-04-29 DIAGNOSIS — M545 Low back pain, unspecified: Secondary | ICD-10-CM

## 2019-04-29 DIAGNOSIS — E669 Obesity, unspecified: Secondary | ICD-10-CM

## 2019-04-29 DIAGNOSIS — E1121 Type 2 diabetes mellitus with diabetic nephropathy: Secondary | ICD-10-CM | POA: Insufficient documentation

## 2019-04-29 DIAGNOSIS — I129 Hypertensive chronic kidney disease with stage 1 through stage 4 chronic kidney disease, or unspecified chronic kidney disease: Secondary | ICD-10-CM

## 2019-04-29 DIAGNOSIS — F329 Major depressive disorder, single episode, unspecified: Secondary | ICD-10-CM

## 2019-04-29 DIAGNOSIS — M159 Polyosteoarthritis, unspecified: Secondary | ICD-10-CM

## 2019-04-29 DIAGNOSIS — M25562 Pain in left knee: Secondary | ICD-10-CM

## 2019-04-29 DIAGNOSIS — K219 Gastro-esophageal reflux disease without esophagitis: Secondary | ICD-10-CM

## 2019-04-29 DIAGNOSIS — N183 Chronic kidney disease, stage 3 unspecified: Secondary | ICD-10-CM

## 2019-04-29 DIAGNOSIS — F419 Anxiety disorder, unspecified: Secondary | ICD-10-CM

## 2019-04-29 DIAGNOSIS — E782 Mixed hyperlipidemia: Secondary | ICD-10-CM

## 2019-06-16 ENCOUNTER — Ambulatory Visit: Payer: Medicare HMO | Admitting: Family Medicine

## 2019-10-10 ENCOUNTER — Ambulatory Visit: Payer: Medicare HMO | Admitting: Family Medicine

## 2019-10-21 ENCOUNTER — Other Ambulatory Visit (HOSPITAL_COMMUNITY): Payer: Self-pay | Admitting: Nephrology

## 2019-10-21 DIAGNOSIS — I10 Essential (primary) hypertension: Secondary | ICD-10-CM

## 2019-10-27 ENCOUNTER — Encounter (HOSPITAL_COMMUNITY): Payer: Self-pay

## 2019-10-27 ENCOUNTER — Other Ambulatory Visit: Payer: Self-pay

## 2019-10-27 ENCOUNTER — Encounter (HOSPITAL_COMMUNITY)
Admission: RE | Admit: 2019-10-27 | Discharge: 2019-10-27 | Disposition: A | Discharge status: Home / Self Care | Payer: Medicare HMO | Source: Ambulatory Visit | Attending: Nephrology | Admitting: Nephrology

## 2019-10-27 DIAGNOSIS — D509 Iron deficiency anemia, unspecified: Secondary | ICD-10-CM | POA: Diagnosis present

## 2019-10-27 MED ORDER — SODIUM CHLORIDE 0.9 % IV SOLN: Freq: Once | INTRAVENOUS | Status: AC

## 2019-10-27 MED ORDER — SODIUM CHLORIDE 0.9 % IV SOLN
510.0000 mg | Freq: Once | INTRAVENOUS | Status: AC
  Administered 2019-10-27: 510 mg via INTRAVENOUS
  Filled 2019-10-27: qty 17

## 2019-11-03 ENCOUNTER — Encounter (HOSPITAL_COMMUNITY): Payer: Self-pay

## 2019-11-03 ENCOUNTER — Encounter (HOSPITAL_COMMUNITY)
Admission: RE | Admit: 2019-11-03 | Discharge: 2019-11-03 | Disposition: A | Discharge status: Home / Self Care | Payer: Medicare HMO | Source: Ambulatory Visit | Attending: Nephrology | Admitting: Nephrology

## 2019-11-03 ENCOUNTER — Other Ambulatory Visit: Payer: Self-pay

## 2019-11-03 DIAGNOSIS — D509 Iron deficiency anemia, unspecified: Secondary | ICD-10-CM | POA: Insufficient documentation

## 2019-11-03 MED ORDER — SODIUM CHLORIDE 0.9 % IV SOLN: Freq: Once | INTRAVENOUS | Status: AC

## 2019-11-03 MED ORDER — SODIUM CHLORIDE 0.9 % IV SOLN
510.0000 mg | Freq: Once | INTRAVENOUS | Status: AC
  Administered 2019-11-03: 510 mg via INTRAVENOUS
  Filled 2019-11-03: qty 17

## 2019-11-04 ENCOUNTER — Ambulatory Visit (HOSPITAL_COMMUNITY)
Admission: RE | Admit: 2019-11-04 | Discharge: 2019-11-04 | Disposition: A | Discharge status: Home / Self Care | Payer: Medicare HMO | Source: Ambulatory Visit | Attending: Hematology | Admitting: Hematology

## 2019-11-04 DIAGNOSIS — I10 Essential (primary) hypertension: Secondary | ICD-10-CM | POA: Diagnosis present

## 2019-11-04 NOTE — Progress Notes (Signed)
*  PRELIMINARY RESULTS* Echocardiogram 2D Echocardiogram has been performed.  Leavy Cella 11/04/2019, 3:54 PM

## 2021-05-22 ENCOUNTER — Encounter (HOSPITAL_COMMUNITY)
Admission: RE | Admit: 2021-05-22 | Discharge: 2021-05-22 | Disposition: A | Discharge status: Home / Self Care | Payer: Medicare HMO | Source: Ambulatory Visit | Attending: Nephrology | Admitting: Nephrology

## 2021-05-22 DIAGNOSIS — D509 Iron deficiency anemia, unspecified: Secondary | ICD-10-CM | POA: Diagnosis not present

## 2021-05-22 MED ORDER — SODIUM CHLORIDE 0.9 % IV SOLN
510.0000 mg | Freq: Once | INTRAVENOUS | Status: AC
  Administered 2021-05-22: 13:00:00 510 mg via INTRAVENOUS
  Filled 2021-05-22: qty 17

## 2021-05-22 MED ORDER — SODIUM CHLORIDE 0.9 % IV SOLN: Freq: Once | INTRAVENOUS | Status: AC

## 2021-05-29 ENCOUNTER — Encounter (HOSPITAL_COMMUNITY)
Admission: RE | Admit: 2021-05-29 | Discharge: 2021-05-29 | Disposition: A | Discharge status: Home / Self Care | Payer: Medicare HMO | Source: Ambulatory Visit | Attending: Nephrology | Admitting: Nephrology

## 2021-05-29 ENCOUNTER — Other Ambulatory Visit: Payer: Self-pay

## 2021-05-29 DIAGNOSIS — D509 Iron deficiency anemia, unspecified: Secondary | ICD-10-CM | POA: Diagnosis not present

## 2021-05-29 MED ORDER — SODIUM CHLORIDE 0.9 % IV SOLN: INTRAVENOUS | Status: DC

## 2021-05-29 MED ORDER — SODIUM CHLORIDE 0.9 % IV SOLN
510.0000 mg | Freq: Once | INTRAVENOUS | Status: AC
  Administered 2021-05-29: 15:00:00 510 mg via INTRAVENOUS
  Filled 2021-05-29: qty 17

## 2021-06-06 ENCOUNTER — Other Ambulatory Visit (HOSPITAL_COMMUNITY): Payer: Self-pay | Admitting: Nephrology

## 2021-06-06 DIAGNOSIS — I10 Essential (primary) hypertension: Secondary | ICD-10-CM

## 2021-07-11 ENCOUNTER — Ambulatory Visit (HOSPITAL_COMMUNITY)
Admission: RE | Admit: 2021-07-11 | Discharge: 2021-07-11 | Disposition: A | Discharge status: Home / Self Care | Payer: Medicare HMO | Source: Ambulatory Visit | Attending: Nephrology | Admitting: Nephrology

## 2021-07-11 DIAGNOSIS — I10 Essential (primary) hypertension: Secondary | ICD-10-CM | POA: Diagnosis not present

## 2021-07-11 LAB — ECHOCARDIOGRAM COMPLETE
AR max vel: 0.94 cm2
AV Area VTI: 0.92 cm2
AV Area mean vel: 0.82 cm2
AV Mean grad: 22.7 mmHg
AV Peak grad: 38.8 mmHg
Ao pk vel: 3.11 m/s
Area-P 1/2: 2.56 cm2
S' Lateral: 2.1 cm

## 2021-07-11 NOTE — Progress Notes (Signed)
*  PRELIMINARY RESULTS* Echocardiogram 2D Echocardiogram has been performed.  Kayla Noble 07/11/2021, 3:59 PM

## 2021-09-25 ENCOUNTER — Ambulatory Visit: Payer: Medicare HMO | Admitting: Cardiology

## 2021-09-27 ENCOUNTER — Ambulatory Visit: Payer: Medicare HMO | Admitting: Cardiology

## 2021-09-27 ENCOUNTER — Encounter: Payer: Self-pay | Admitting: Cardiology

## 2021-09-27 VITALS — BP 132/60 | HR 69 | Ht 59.0 in | Wt 194.6 lb

## 2021-09-27 DIAGNOSIS — I350 Nonrheumatic aortic (valve) stenosis: Secondary | ICD-10-CM

## 2021-09-27 DIAGNOSIS — I10 Essential (primary) hypertension: Secondary | ICD-10-CM | POA: Diagnosis not present

## 2021-09-27 NOTE — Patient Instructions (Signed)
Medication Instructions:  Continue all current medications.   Labwork: none  Testing/Procedures: none  Follow-Up: 6 months   Any Other Special Instructions Will Be Listed Below (If Applicable).   If you need a refill on your cardiac medications before your next appointment, please call your pharmacy.  

## 2021-09-27 NOTE — Progress Notes (Signed)
? ? ? ?Clinical Summary ?Ms. Starzyk is a 88 y.o.female seen today as a new consult,referred by Dr Theador Hawthorne for the following medical problems.  ? ? ?1.Aortic stenosis ?- 07/2021 echo LVEF 60-65%, no WMAs, grade I dd, AV mean grad 23, AVA VTI 0.94, DI 0.36, SVI 44 ?- uses walker at home, sedentary lifestyle. Limited by chronic back pain. For level of exertion she achieved denies significant symptoms of SOB or chest pains.  ? ? ?2. CKD IV ?- followed by Dr Theador Hawthorne nephrology ? ? ?3. HTN ?- compliant with meds ? ? ?4. Chronic diastolic HF ?- diuretics per renal ?- mild LE edema.  ? ?Past Medical History:  ?Diagnosis Date  ? Anxiety disorder, unspecified   ? Gastro-esophageal reflux disease without esophagitis   ? Hypertensive chronic kidney disease with stage 1 through stage 4 chronic kidney disease, or unspecified chronic kidney disease   ? Kidney disease, chronic, stage III (moderate, EGFR 30-59 ml/min) (HCC)   ? Low back pain   ? Major depressive disorder, single episode, unspecified   ? Mixed hyperlipidemia   ? Obesity, unspecified   ? Pain in left knee   ? Polyosteoarthritis, unspecified   ? Type 2 diabetes mellitus with diabetic nephropathy ([place])   ? ? ? ?Allergies  ?Allergen Reactions  ? Sulfa Antibiotics Itching and Swelling  ? ? ? ?Current Outpatient Medications  ?Medication Sig Dispense Refill  ? Acetaminophen-Codeine 300-30 MG tablet Take 1 tablet by mouth every 4 (four) hours as needed for pain.    ? ALPRAZolam (XANAX) 1 MG tablet Take 1 mg by mouth 3 (three) times daily as needed for anxiety.    ? amLODipine (NORVASC) 5 MG tablet Take 5 mg by mouth daily.     ? aspirin EC 81 MG tablet Take 81 mg by mouth daily.    ? baclofen (LIORESAL) 10 MG tablet Take 10 mg by mouth 3 (three) times daily as needed for muscle spasms.    ? carvedilol (COREG) 3.125 MG tablet Take 3.125 mg by mouth 2 (two) times daily with a meal.    ? DULoxetine (CYMBALTA) 30 MG capsule Take 30 mg by mouth daily.    ? DULoxetine (CYMBALTA)  60 MG capsule Take 60 mg by mouth daily.    ? Ergocalciferol 50 MCG (2000 UT) TABS Take 50,000 Units by mouth daily.     ? esomeprazole (NEXIUM) 40 MG capsule Take 40 mg by mouth daily at 12 noon.    ? furosemide (LASIX) 20 MG tablet Take 20 mg by mouth 2 (two) times daily. 40 mg AM 20 mg PM    ? hydrocortisone (PROCTOZONE-HC) 2.5 % rectal cream Place 1 application rectally 4 (four) times daily as needed for hemorrhoids or anal itching.    ? Insulin Detemir (LEVEMIR FLEXTOUCH) 100 UNIT/ML Pen Inject 30 Units into the skin daily.    ? levothyroxine (SYNTHROID) 25 MCG tablet Take 25 mcg by mouth daily before breakfast.    ? linagliptin (TRADJENTA) 5 MG TABS tablet Take 5 mg by mouth daily.    ? lisinopril (ZESTRIL) 5 MG tablet Take 5 mg by mouth daily.     ? lisinopril-hydrochlorothiazide (ZESTORETIC) 20-12.5 MG tablet Take 1 tablet by mouth daily.    ? Multiple Vitamin (MULTIVITAMIN) tablet Take 1 tablet by mouth daily.    ? ondansetron (ZOFRAN) 4 MG tablet Take 4 mg by mouth 4 (four) times daily as needed for nausea or vomiting.    ? pioglitazone (ACTOS) 30  MG tablet Take 30 mg by mouth daily.    ? pregabalin (LYRICA) 75 MG capsule Take 75 mg by mouth daily.    ? QUEtiapine (SEROQUEL) 50 MG tablet Take 50 mg by mouth daily.    ? raloxifene (EVISTA) 60 MG tablet Take 60 mg by mouth daily.    ? rosuvastatin (CRESTOR) 20 MG tablet Take 20 mg by mouth daily.    ? traMADol (ULTRAM) 50 MG tablet Take 50-100 mg by mouth every 6 (six) hours as needed.    ? ?No current facility-administered medications for this visit.  ? ? ? ?Past Surgical History:  ?Procedure Laterality Date  ? bilat foot surgery    ? CATARACT EXTRACTION    ? CHOLECYSTECTOMY    ? cspine surgery    ? KNEE SURGERY    ? ? ? ?Allergies  ?Allergen Reactions  ? Sulfa Antibiotics Itching and Swelling  ? ? ? ? ?No family history on file. ? ? ?Social History ?Ms. Goatley reports that she quit smoking about 31 years ago. Her smoking use included cigarettes. She has  never used smokeless tobacco. ?Ms. Carstens reports no history of alcohol use. ? ? ?Review of Systems ?CONSTITUTIONAL: No weight loss, fever, chills, weakness or fatigue.  ?HEENT: Eyes: No visual loss, blurred vision, double vision or yellow sclerae.No hearing loss, sneezing, congestion, runny nose or sore throat.  ?SKIN: No rash or itching.  ?CARDIOVASCULAR: per hpi ?RESPIRATORY: No shortness of breath, cough or sputum.  ?GASTROINTESTINAL: No anorexia, nausea, vomiting or diarrhea. No abdominal pain or blood.  ?GENITOURINARY: No burning on urination, no polyuria ?NEUROLOGICAL: No headache, dizziness, syncope, paralysis, ataxia, numbness or tingling in the extremities. No change in bowel or bladder control.  ?MUSCULOSKELETAL: +back pain ?LYMPHATICS: No enlarged nodes. No history of splenectomy.  ?PSYCHIATRIC: No history of depression or anxiety.  ?ENDOCRINOLOGIC: No reports of sweating, cold or heat intolerance. No polyuria or polydipsia.  ?. ? ? ?Physical Examination ?Today's Vitals  ? 09/27/21 1318  ?BP: 132/60  ?Pulse: 69  ?SpO2: 95%  ?Weight: 194 lb 9.6 oz (88.3 kg)  ?Height: _0  (1.499 m)  ? ?Body mass index is 39.3 kg/m?. ? ?Gen: resting comfortably, no acute distress ?HEENT: no scleral icterus, pupils equal round and reactive, no palptable cervical adenopathy,  ?CV: RRR, 3/6 systolic murmur rusb, no jvd ?Resp: Clear to auscultation bilaterally ?GI: abdomen is soft, non-tender, non-distended, normal bowel sounds, no hepatosplenomegaly ?MSK: extremities are warm, no edema.  ?Skin: warm, no rash ?Neuro:  no focal deficits ?Psych: appropriate affect ? ? ?Diagnostic Studies ? ?2D echo done on November 04, 2019 ?Left Ventricle: Left ventricular ejection fraction, by estimation, is 60 to 65%. The left ventricle has normal function. The left ventricle has no regional wall motion abnormalities. The left ventricular internal cavity size was normal in size. There is mild left ventricular hypertrophy. Left ventricular  diastolic parameters are consistent with age-related delayed relaxation (normal). Elevated left ventricular end-diastolic pressure.  ? ?Aortic Valve: The aortic valve is tricuspid. . There is moderate thickening and moderate calcification of the aortic valve. Aortic valve regurgitation is not visualized. Mild aortic stenosis is present. There is moderate thickening of the aortic valve. There is moderate calcification of the aortic valve. Aortic valve mean gradient measures 9.7 mmHg. Aortic valve peak gradient measures 17.0 mmHg. Aortic valve area, by VTI measures 1.26 cm?.  ? ?2D echo done on July 11, 2021 ?1. Left ventricular ejection fraction, by estimation, is 60 to 65%. The left  ventricle has normal function. The left ventricle has no regional wall motion abnormalities. There is mild left ventricular hypertrophy. Left ventricular diastolic parameters  ?are consistent with Grade I diastolic dysfunction (impaired relaxation).  ?2. Right ventricular systolic function is normal. The right ventricular size is normal. There is normal pulmonary artery systolic pressure.  ?3. The mitral valve is abnormal. Trivial mitral valve regurgitation. No evidence of mitral stenosis. Moderate mitral annular calcification.  ?4. Valve area 0.91 but DVI 0.36 Valve not imaged in basal short axis visible opening on PSLX AS has worsened since 11/11/19 mean gradient 9.7 to 22.7 and peak 17-> 38.8 mmHg . The aortic valve is tricuspid. There is moderate calcification of the aortic  ?valve. There is moderate thickening of the aortic valve. Aortic valve regurgitation is trivial. Moderate aortic valve stenosis.  ?5. The inferior vena cava is normal in size with greater than 50% respiratory variability, suggesting right atrial pressure of 3 mmHg.  ? ? ?Assessment and Plan  ?Aortic stenosis ?-consensus of echo data supports moderate AS ?- no significant symptoms, though sedentary due to chronic back pain ?- repeat echo annually, earlier if new  symptoms.  ? ?2. Chronic diastoilc HF ?- defer diuretics to renal given her advanced renal disease ? ? ?3. HTN ?- at goal, continue current meds ? ? ?EKG today shows NSR ? ? ?Arnoldo Lenis, M.D. ?

## 2021-10-01 ENCOUNTER — Ambulatory Visit: Payer: Medicare HMO | Admitting: Cardiology

## 2022-04-10 ENCOUNTER — Encounter: Payer: Self-pay | Admitting: Cardiology

## 2022-04-10 ENCOUNTER — Ambulatory Visit: Payer: Medicare HMO | Attending: Cardiology | Admitting: Cardiology

## 2022-04-10 VITALS — BP 135/65 | HR 94 | Ht 59.0 in | Wt 174.0 lb

## 2022-04-10 DIAGNOSIS — M79605 Pain in left leg: Secondary | ICD-10-CM

## 2022-04-10 DIAGNOSIS — M79604 Pain in right leg: Secondary | ICD-10-CM | POA: Diagnosis not present

## 2022-04-10 DIAGNOSIS — I350 Nonrheumatic aortic (valve) stenosis: Secondary | ICD-10-CM | POA: Diagnosis not present

## 2022-04-10 DIAGNOSIS — I5032 Chronic diastolic (congestive) heart failure: Secondary | ICD-10-CM | POA: Diagnosis not present

## 2022-04-10 NOTE — Patient Instructions (Signed)
Medication Instructions:  Continue all other medications.     Labwork: none  Testing/Procedures: Your physician has requested that you have an echocardiogram. Echocardiography is a painless test that uses sound waves to create images of your heart. It provides your doctor with information about the size and shape of your heart and how well your heart's chambers and valves are working. This procedure takes approximately one hour. There are no restrictions for this procedure. Please do NOT wear cologne, perfume, aftershave, or lotions (deodorant is allowed). Please arrive 15 minutes prior to your appointment time. Your physician has requested that you have an ankle brachial index (ABI). During this test an ultrasound and blood pressure cuff are used to evaluate the arteries that supply the arms and legs with blood. Allow thirty minutes for this exam. There are no restrictions or special instructions.  Office will contact with results via phone, letter or mychart.     Follow-Up: 6 months   Any Other Special Instructions Will Be Listed Below (If Applicable).   If you need a refill on your cardiac medications before your next appointment, please call your pharmacy.

## 2022-04-10 NOTE — Addendum Note (Signed)
Addended by: Laurine Blazer on: 04/10/2022 05:39 PM   Modules accepted: Orders

## 2022-04-10 NOTE — Progress Notes (Signed)
Clinical Summary Kayla Noble is a 88 y.o.female seen today for follow up of the following medical problems.   1.Aortic stenosis - 07/2021 echo LVEF 60-65%, no WMAs, grade I dd, AV mean grad 23, AVA VTI 0.94, DI 0.36, SVI 44 - uses walker at home, sedentary lifestyle. Limited by chronic back pain. For level of exertion she achieved denies significant symptoms of SOB or chest pains.   - no SOB/DOE, no chest pains.      2. CKD IV - followed by Dr Theador Hawthorne nephrology     3. HTN - compliant with meds  - recent pcp visit 134/60     4. Chronic diastolic HF - diuretics per renal - no recent edmea  5. Leg pains - cramping pain bilateral thighs and calves with walking  Past Medical History:  Diagnosis Date   Anxiety disorder, unspecified    Gastro-esophageal reflux disease without esophagitis    Hypertensive chronic kidney disease with stage 1 through stage 4 chronic kidney disease, or unspecified chronic kidney disease    Kidney disease, chronic, stage III (moderate, EGFR 30-59 ml/min) (HCC)    Low back pain    Major depressive disorder, single episode, unspecified    Mixed hyperlipidemia    Obesity, unspecified    Pain in left knee    Polyosteoarthritis, unspecified    Type 2 diabetes mellitus with diabetic nephropathy (HCC)      Allergies  Allergen Reactions   Sulfa Antibiotics Itching and Swelling     Current Outpatient Medications  Medication Sig Dispense Refill   Acetaminophen-Codeine 300-30 MG tablet Take 1 tablet by mouth every 4 (four) hours as needed for pain.     ALPRAZolam (XANAX) 1 MG tablet Take 1 mg by mouth 3 (three) times daily as needed for anxiety.     amLODipine (NORVASC) 10 MG tablet Take 10 mg by mouth daily.     aspirin EC 81 MG tablet Take 81 mg by mouth daily.     calcitRIOL (ROCALTROL) 0.25 MCG capsule Take 0.25 mcg by mouth. Every Monday, Wednesday and friday     carvedilol (COREG) 6.25 MG tablet Take 6.25 mg by mouth 2 (two) times daily  with a meal.     Cholecalciferol (VITAMIN D-3 PO) Take 50 mcg by mouth every other day.     Docusate Calcium (STOOL SOFTENER PO) Take 1 tablet by mouth daily.     DULoxetine (CYMBALTA) 60 MG capsule Take 60 mg by mouth daily.     furosemide (LASIX) 20 MG tablet Take 20 mg by mouth 2 (two) times daily. 40 mg AM 20 mg PM     hydrALAZINE (APRESOLINE) 50 MG tablet Take 50 mg by mouth in the morning and at bedtime.     Insulin Detemir (LEVEMIR FLEXTOUCH) 100 UNIT/ML Pen Inject 50 Units into the skin daily.     levothyroxine (SYNTHROID) 50 MCG tablet Take 50 mcg by mouth daily before breakfast.     linagliptin (TRADJENTA) 5 MG TABS tablet Take 5 mg by mouth daily.     Multiple Vitamin (MULTIVITAMIN) tablet Take 1 tablet by mouth daily.     QUEtiapine (SEROQUEL) 50 MG tablet Take 50 mg by mouth daily.     rosuvastatin (CRESTOR) 20 MG tablet Take 20 mg by mouth daily.     spironolactone (ALDACTONE) 25 MG tablet Take 12.5 mg by mouth daily.     pioglitazone (ACTOS) 30 MG tablet Take 30 mg by mouth daily. (Patient not taking:  Reported on 09/27/2021)     No current facility-administered medications for this visit.     Past Surgical History:  Procedure Laterality Date   bilat foot surgery     CATARACT EXTRACTION     CHOLECYSTECTOMY     cspine surgery     KNEE SURGERY       Allergies  Allergen Reactions   Sulfa Antibiotics Itching and Swelling      No family history on file.   Social History Kayla Noble reports that she quit smoking about 31 years ago. Her smoking use included cigarettes. She has never been exposed to tobacco smoke. She has never used smokeless tobacco. Kayla Noble reports no history of alcohol use.   Review of Systems CONSTITUTIONAL: No weight loss, fever, chills, weakness or fatigue.  HEENT: Eyes: No visual loss, blurred vision, double vision or yellow sclerae.No hearing loss, sneezing, congestion, runny nose or sore throat.  SKIN: No rash or itching.   CARDIOVASCULAR: per hpi RESPIRATORY: No shortness of breath, cough or sputum.  GASTROINTESTINAL: No anorexia, nausea, vomiting or diarrhea. No abdominal pain or blood.  GENITOURINARY: No burning on urination, no polyuria NEUROLOGICAL: No headache, dizziness, syncope, paralysis, ataxia, numbness or tingling in the extremities. No change in bowel or bladder control.  MUSCULOSKELETAL: No muscle, back pain, joint pain or stiffness.  LYMPHATICS: No enlarged nodes. No history of splenectomy.  PSYCHIATRIC: No history of depression or anxiety.  ENDOCRINOLOGIC: No reports of sweating, cold or heat intolerance. No polyuria or polydipsia.  Marland Kitchen   Physical Examination Vitals:   04/10/22 1553  BP: (!) 148/72  Pulse: 94  SpO2: (!) 61%   Filed Weights   04/10/22 1553  Weight: 174 lb (78.9 kg)    Gen: resting comfortably, no acute distress HEENT: no scleral icterus, pupils equal round and reactive, no palptable cervical adenopathy,  CV: RRR, 3/6 systolic murmur rusb, no jvd Resp: Clear to auscultation bilaterally GI: abdomen is soft, non-tender, non-distended, normal bowel sounds, no hepatosplenomegaly MSK: extremities are warm, no edema.  Skin: warm, no rash Neuro:  no focal deficits Psych: appropriate affect   Diagnostic Studies  2D echo done on November 04, 2019 Left Ventricle: Left ventricular ejection fraction, by estimation, is 60 to 65%. The left ventricle has normal function. The left ventricle has no regional wall motion abnormalities. The left ventricular internal cavity size was normal in size. There is mild left ventricular hypertrophy. Left ventricular diastolic parameters are consistent with age-related delayed relaxation (normal). Elevated left ventricular end-diastolic pressure.   Aortic Valve: The aortic valve is tricuspid. . There is moderate thickening and moderate calcification of the aortic valve. Aortic valve regurgitation is not visualized. Mild aortic stenosis is present.  There is moderate thickening of the aortic valve. There is moderate calcification of the aortic valve. Aortic valve mean gradient measures 9.7 mmHg. Aortic valve peak gradient measures 17.0 mmHg. Aortic valve area, by VTI measures 1.26 cm.   2D echo done on July 11, 2021 1. Left ventricular ejection fraction, by estimation, is 60 to 65%. The left ventricle has normal function. The left ventricle has no regional wall motion abnormalities. There is mild left ventricular hypertrophy. Left ventricular diastolic parameters  are consistent with Grade I diastolic dysfunction (impaired relaxation).  2. Right ventricular systolic function is normal. The right ventricular size is normal. There is normal pulmonary artery systolic pressure.  3. The mitral valve is abnormal. Trivial mitral valve regurgitation. No evidence of mitral stenosis. Moderate mitral annular calcification.  4. Valve area 0.91 but DVI 0.36 Valve not imaged in basal short axis visible opening on PSLX AS has worsened since 11/11/19 mean gradient 9.7 to 22.7 and peak 17-> 38.8 mmHg . The aortic valve is tricuspid. There is moderate calcification of the aortic  valve. There is moderate thickening of the aortic valve. Aortic valve regurgitation is trivial. Moderate aortic valve stenosis.  5. The inferior vena cava is normal in size with greater than 50% respiratory variability, suggesting right atrial pressure of 3 mmHg.    Assessment and Plan   Aortic stenosis -consensus of echo data supports moderate AS - no symptoms, repeat echo 07/2022   2. HFpEF - euvolemic today,we have deferred diuretics to neprhology given coeexisting CKD     3. HTN - at goal, continue current meds - essentially at goal, continue current meds  4.Leg pains - order ABIs  Arnoldo Lenis, M.D.

## 2022-04-14 ENCOUNTER — Other Ambulatory Visit: Payer: Self-pay | Admitting: Cardiology

## 2022-04-14 DIAGNOSIS — I5032 Chronic diastolic (congestive) heart failure: Secondary | ICD-10-CM

## 2022-04-14 DIAGNOSIS — I350 Nonrheumatic aortic (valve) stenosis: Secondary | ICD-10-CM

## 2022-04-14 DIAGNOSIS — I739 Peripheral vascular disease, unspecified: Secondary | ICD-10-CM

## 2022-04-14 DIAGNOSIS — M79604 Pain in right leg: Secondary | ICD-10-CM

## 2022-04-17 ENCOUNTER — Inpatient Hospital Stay: Payer: Medicare HMO

## 2022-04-17 ENCOUNTER — Inpatient Hospital Stay: Payer: Medicare HMO | Attending: Hematology | Admitting: Hematology

## 2022-04-17 VITALS — BP 149/49 | HR 56 | Temp 97.9°F | Resp 17 | Ht 59.0 in | Wt 174.9 lb

## 2022-04-17 DIAGNOSIS — I739 Peripheral vascular disease, unspecified: Secondary | ICD-10-CM

## 2022-04-17 DIAGNOSIS — Z803 Family history of malignant neoplasm of breast: Secondary | ICD-10-CM | POA: Diagnosis not present

## 2022-04-17 DIAGNOSIS — Z882 Allergy status to sulfonamides status: Secondary | ICD-10-CM | POA: Diagnosis not present

## 2022-04-17 DIAGNOSIS — D72828 Other elevated white blood cell count: Secondary | ICD-10-CM | POA: Insufficient documentation

## 2022-04-17 DIAGNOSIS — M159 Polyosteoarthritis, unspecified: Secondary | ICD-10-CM | POA: Diagnosis not present

## 2022-04-17 DIAGNOSIS — Z9049 Acquired absence of other specified parts of digestive tract: Secondary | ICD-10-CM | POA: Diagnosis not present

## 2022-04-17 DIAGNOSIS — Z87440 Personal history of urinary (tract) infections: Secondary | ICD-10-CM | POA: Diagnosis not present

## 2022-04-17 DIAGNOSIS — D720 Genetic anomalies of leukocytes: Secondary | ICD-10-CM | POA: Diagnosis not present

## 2022-04-17 DIAGNOSIS — R252 Cramp and spasm: Secondary | ICD-10-CM | POA: Diagnosis not present

## 2022-04-17 DIAGNOSIS — Z87891 Personal history of nicotine dependence: Secondary | ICD-10-CM | POA: Insufficient documentation

## 2022-04-17 DIAGNOSIS — Z79899 Other long term (current) drug therapy: Secondary | ICD-10-CM | POA: Insufficient documentation

## 2022-04-17 DIAGNOSIS — D72825 Bandemia: Secondary | ICD-10-CM | POA: Diagnosis not present

## 2022-04-17 DIAGNOSIS — D729 Disorder of white blood cells, unspecified: Secondary | ICD-10-CM

## 2022-04-17 DIAGNOSIS — D72829 Elevated white blood cell count, unspecified: Secondary | ICD-10-CM | POA: Insufficient documentation

## 2022-04-17 LAB — CBC WITH DIFFERENTIAL/PLATELET
Abs Immature Granulocytes: 0.03 10*3/uL (ref 0.00–0.07)
Basophils Absolute: 0.1 10*3/uL (ref 0.0–0.1)
Basophils Relative: 1 %
Eosinophils Absolute: 0.3 10*3/uL (ref 0.0–0.5)
Eosinophils Relative: 2 %
HCT: 36.3 % (ref 36.0–46.0)
Hemoglobin: 12.2 g/dL (ref 12.0–15.0)
Immature Granulocytes: 0 %
Lymphocytes Relative: 16 %
Lymphs Abs: 2.3 10*3/uL (ref 0.7–4.0)
MCH: 28.6 pg (ref 26.0–34.0)
MCHC: 33.6 g/dL (ref 30.0–36.0)
MCV: 85.2 fL (ref 80.0–100.0)
Monocytes Absolute: 0.9 10*3/uL (ref 0.1–1.0)
Monocytes Relative: 6 %
Neutro Abs: 10.4 10*3/uL — ABNORMAL HIGH (ref 1.7–7.7)
Neutrophils Relative %: 75 %
Platelets: 340 10*3/uL (ref 150–400)
RBC: 4.26 MIL/uL (ref 3.87–5.11)
RDW: 12.7 % (ref 11.5–15.5)
WBC: 13.9 10*3/uL — ABNORMAL HIGH (ref 4.0–10.5)
nRBC: 0 % (ref 0.0–0.2)

## 2022-04-17 LAB — SEDIMENTATION RATE: Sed Rate: 58 mm/hr — ABNORMAL HIGH (ref 0–22)

## 2022-04-17 LAB — LACTATE DEHYDROGENASE: LDH: 206 U/L — ABNORMAL HIGH (ref 98–192)

## 2022-04-17 LAB — C-REACTIVE PROTEIN: CRP: 0.7 mg/dL (ref <1.0)

## 2022-04-17 NOTE — Progress Notes (Signed)
CONSULT NOTE  Patient Care Team: Leeanne Rio, MD as PCP - General (Family Medicine) Harl Bowie Alphonse Guild, MD as PCP - Cardiology (Cardiology)  CHIEF COMPLAINTS/PURPOSE OF CONSULTATION:  Leukocytosis  HISTORY OF PRESENTING ILLNESS:  Kayla Noble 88 y.o. female is seen in consultation today at the request of Dr. Theador Hawthorne for further work-up and management of neutrophilic leukocytosis.  CBC on 03/10/2022 showed white count 14.1 with elevated absolute neutrophil count of 10.3.  Platelet count and hemoglobin was normal.  Review of labs showed elevated white count raising up to 15.4 K since January 2021.  She denies any fevers or night sweats.  Lost about 20 pounds in the last 6 months but was trying to lose weight by cutting out sugars and started taking Lasix.  She usually have UTIs twice a year.  She had tooth infection x2 this year.  She also reported feet and leg cramps.  She lives at home with her husband.  She has trouble walking and had fallen a lot in the past due to back issues and arthritis.  She quit smoking 30 years ago.  MEDICAL HISTORY:  Past Medical History:  Diagnosis Date   Anxiety disorder, unspecified    Gastro-esophageal reflux disease without esophagitis    Hypertensive chronic kidney disease with stage 1 through stage 4 chronic kidney disease, or unspecified chronic kidney disease    Kidney disease, chronic, stage III (moderate, EGFR 30-59 ml/min) (HCC)    Low back pain    Major depressive disorder, single episode, unspecified    Mixed hyperlipidemia    Obesity, unspecified    Pain in left knee    Polyosteoarthritis, unspecified    Type 2 diabetes mellitus with diabetic nephropathy ([place])     SURGICAL HISTORY: Past Surgical History:  Procedure Laterality Date   bilat foot surgery     CATARACT EXTRACTION     CHOLECYSTECTOMY     cspine surgery     KNEE SURGERY      SOCIAL HISTORY: Social History   Socioeconomic History   Marital status: Married     Spouse name: Not on file   Number of children: Not on file   Years of education: Not on file   Highest education level: Not on file  Occupational History   Not on file  Tobacco Use   Smoking status: Former    Types: Cigarettes    Quit date: 06/02/1990    Years since quitting: 31.8    Passive exposure: Never   Smokeless tobacco: Never  Vaping Use   Vaping Use: Never used  Substance and Sexual Activity   Alcohol use: Never   Drug use: Never   Sexual activity: Not on file  Other Topics Concern   Not on file  Social History Narrative   Not on file   Social Determinants of Health   Financial Resource Strain: Not on file  Food Insecurity: No Food Insecurity (04/17/2022)   Hunger Vital Sign    Worried About Running Out of Food in the Last Year: Never true    Ran Out of Food in the Last Year: Never true  Transportation Needs: No Transportation Needs (04/17/2022)   PRAPARE - Hydrologist (Medical): No    Lack of Transportation (Non-Medical): No  Physical Activity: Not on file  Stress: Not on file  Social Connections: Not on file  Intimate Partner Violence: Not At Risk (04/17/2022)   Humiliation, Afraid, Rape, and Kick questionnaire  Fear of Current or Ex-Partner: No    Emotionally Abused: No    Physically Abused: No    Sexually Abused: No    FAMILY HISTORY: No family history on file.  ALLERGIES:  is allergic to sulfa antibiotics.  MEDICATIONS:  Current Outpatient Medications  Medication Sig Dispense Refill   Acetaminophen-Codeine 300-30 MG tablet Take 1 tablet by mouth every 4 (four) hours as needed for pain.     ALPRAZolam (XANAX) 1 MG tablet Take 1 mg by mouth 3 (three) times daily as needed for anxiety.     amLODipine (NORVASC) 10 MG tablet Take 10 mg by mouth daily.     aspirin EC 81 MG tablet Take 81 mg by mouth daily.     calcitRIOL (ROCALTROL) 0.25 MCG capsule Take 0.25 mcg by mouth. Every Monday, Wednesday and friday     carvedilol  (COREG) 6.25 MG tablet Take 6.25 mg by mouth 2 (two) times daily with a meal.     Cholecalciferol (VITAMIN D-3 PO) Take 50 mcg by mouth every other day.     Docusate Calcium (STOOL SOFTENER PO) Take 1 tablet by mouth daily.     DULoxetine (CYMBALTA) 60 MG capsule Take 60 mg by mouth daily.     furosemide (LASIX) 40 MG tablet Take 40 mg by mouth 2 (two) times daily.     hydrALAZINE (APRESOLINE) 50 MG tablet Take 50 mg by mouth in the morning and at bedtime.     Insulin Detemir (LEVEMIR FLEXTOUCH) 100 UNIT/ML Pen Inject 50 Units into the skin daily.     LEADER UNIFINE PENTIPS PLUS 31G X 8 MM MISC USE WITH INSULIN DAILY     levothyroxine (SYNTHROID) 50 MCG tablet Take 50 mcg by mouth daily before breakfast.     linagliptin (TRADJENTA) 5 MG TABS tablet Take 5 mg by mouth daily.     Multiple Vitamin (MULTIVITAMIN) tablet Take 1 tablet by mouth daily.     pioglitazone (ACTOS) 30 MG tablet Take 30 mg by mouth daily.     QUEtiapine (SEROQUEL) 50 MG tablet Take 50 mg by mouth daily.     rosuvastatin (CRESTOR) 20 MG tablet Take 20 mg by mouth daily.     spironolactone (ALDACTONE) 25 MG tablet Take 12.5 mg by mouth daily.     No current facility-administered medications for this visit.    REVIEW OF SYSTEMS:   Constitutional: Denies fevers, chills or abnormal night sweats Eyes: Denies blurriness of vision, double vision or watery eyes Ears, nose, mouth, throat, and face: Denies mucositis or sore throat Respiratory: Denies cough, dyspnea or wheezes Cardiovascular: Denies palpitation, chest discomfort or lower extremity swelling Gastrointestinal:  Denies nausea, heartburn or change in bowel habits Skin: Denies abnormal skin rashes Lymphatics: Denies new lymphadenopathy or easy bruising Neurological:Denies new weaknesses.  Positive for tingling in the feet. Behavioral/Psych: Mood is stable, no new changes  All other systems were reviewed with the patient and are negative.  PHYSICAL  EXAMINATION: ECOG PERFORMANCE STATUS: 1 - Symptomatic but completely ambulatory  Vitals:   04/17/22 1331  BP: (!) 149/49  Pulse: (!) 56  Resp: 17  Temp: 97.9 F (36.6 C)  SpO2: 97%   Filed Weights   04/17/22 1331  Weight: 174 lb 14.4 oz (79.3 kg)    GENERAL:alert, no distress and comfortable SKIN: skin color, texture, turgor are normal, no rashes or significant lesions EYES: normal, conjunctiva are pink and non-injected, sclera clear OROPHARYNX:no exudate, no erythema and lips, buccal mucosa, and tongue normal  NECK: supple, thyroid normal size, non-tender, without nodularity LYMPH:  no palpable lymphadenopathy in the cervical, axillary or inguinal LUNGS: clear to auscultation and percussion with normal breathing effort HEART: regular rate & rhythm and no murmurs and no lower extremity edema ABDOMEN:abdomen soft, non-tender and normal bowel sounds Musculoskeletal:no cyanosis of digits and no clubbing  PSYCH: alert & oriented x 3 with fluent speech NEURO: no focal motor/sensory deficits  LABORATORY DATA:  I have reviewed the data as listed Recent Results (from the past 2160 hour(s))  Sedimentation rate     Status: Abnormal   Collection Time: 04/17/22  2:16 PM  Result Value Ref Range   Sed Rate 58 (H) 0 - 22 mm/hr    Comment: Performed at [facility], [address] [place], [place] [address]  Lactate dehydrogenase     Status: Abnormal   Collection Time: 04/17/22  2:16 PM  Result Value Ref Range   LDH 206 (H) 98 - 192 U/L    Comment: Performed at [facility], [address] [place], [place] [address]  CBC with Differential     Status: Abnormal   Collection Time: 04/17/22  2:16 PM  Result Value Ref Range   WBC 13.9 (H) 4.0 - 10.5 K/uL   RBC 4.26 3.87 - 5.11 MIL/uL   Hemoglobin 12.2 12.0 - 15.0 g/dL   HCT 36.3 36.0 - 46.0 %   MCV 85.2 80.0 - 100.0 fL   MCH 28.6 26.0 - 34.0 pg   MCHC 33.6 30.0 - 36.0 g/dL   RDW 12.7 11.5 - 15.5 %   Platelets 340 150 - 400 K/uL    nRBC 0.0 0.0 - 0.2 %   Neutrophils Relative % 75 %   Neutro Abs 10.4 (H) 1.7 - 7.7 K/uL   Lymphocytes Relative 16 %   Lymphs Abs 2.3 0.7 - 4.0 K/uL   Monocytes Relative 6 %   Monocytes Absolute 0.9 0.1 - 1.0 K/uL   Eosinophils Relative 2 %   Eosinophils Absolute 0.3 0.0 - 0.5 K/uL   Basophils Relative 1 %   Basophils Absolute 0.1 0.0 - 0.1 K/uL   Immature Granulocytes 0 %   Abs Immature Granulocytes 0.03 0.00 - 0.07 K/uL    Comment: Performed at [facility], [address] [place], [place] [address]    RADIOGRAPHIC STUDIES: I have personally reviewed the radiological images as listed and agreed with the findings in the report. No results found.  ASSESSMENT:  1.  Neutrophilic leukocytosis: - Patient seen at the request of Dr. Theador Hawthorne - CBC (03/10/2022): WBC-14.1, Hb-12.7, PLT-378, ANC-10.3, AEC-0.5 - I have reviewed her previous labs.  Her white count has been elevated ranging between 11-14 K since January 2021. - She denies any fevers or night sweats.  Reports 20 pound weight loss in the last 6 months but was trying to lose weight by cutting out sugars.  She was also started on Lasix. - She had tooth infection x2 in the past 6 months and UTI twice a year.  2.  Social/family history: - She lives with her husband at home.  She is accompanied by her daughter Herbert Spires who used to work in our oncology clinic as a Marine scientist. - She has trouble walking due to back issues and arthritis.  She is able to do all her ADLs.  Limited in some of the IADLs.  She worked for [employer].  Quit smoking 30 years ago but smoked 1 pack/day for 30 years. - No family history of  leukemia.  Paternal aunt had breast cancer.  PLAN:  1.  Neutrophilic leukocytosis: - We reviewed labs including several CBC over the past few years. - We discussed differential diagnosis of neutrophilic leukocytosis. - Recommend repeating CBC with differential today.  Will check LDH, ESR/CRP, ANA and rheumatoid factor. - We will  check for myeloproliferative disorders with JAK2 V617F reflex testing and BCR/ABL by FISH. - RTC 4 weeks for follow-up.   All questions were answered. The patient knows to call the clinic with any problems, questions or concerns.      Derek Jack, MD 04/17/22 4:57 PM

## 2022-04-17 NOTE — Patient Instructions (Addendum)
[place] at [facility] Discharge Instructions   You were seen and examined today by Dr. Delton Coombes. He is seeing you today at the request of Dr. Theador Hawthorne for an elevated white blood cell count.   We will get more specialized lab work today to investigate this further.   We will see you back in 4 weeks to review the results.    Thank you for choosing [place] at [facility] to provide your oncology and hematology care.  To afford each patient quality time with our provider, please arrive at least 15 minutes before your scheduled appointment time.   If you have a lab appointment with the [place] please come in thru the Main Entrance and check in at the main information desk.  You need to re-schedule your appointment should you arrive 10 or more minutes late.  We strive to give you quality time with our providers, and arriving late affects you and other patients whose appointments are after yours.  Also, if you no show three or more times for appointments you may be dismissed from the clinic at the providers discretion.     Again, thank you for choosing [facility].  Our hope is that these requests will decrease the amount of time that you wait before being seen by our physicians.       _____________________________________________________________  Should you have questions after your visit to [facility], please contact our office at [phone] and follow the prompts.  Our office hours are 8:00 a.m. and 4:30 p.m. Monday - Friday.  Please note that voicemails left after 4:00 p.m. may not be returned until the following business day.  We are closed weekends and major holidays.  You do have access to a nurse 24-7, just call the main number to the clinic [phone] and do not press any options, hold on the line and a nurse will answer the phone.    For prescription refill requests, have your pharmacy contact our  office and allow 72 hours.    Due to Covid, you will need to wear a mask upon entering the hospital. If you do not have a mask, a mask will be given to you at the Main Entrance upon arrival. For doctor visits, patients may have 1 support person age 73 or older with them. For treatment visits, patients can not have anyone with them due to social distancing guidelines and our immunocompromised population.

## 2022-04-18 LAB — ANTINUCLEAR ANTIBODIES, IFA: ANA Ab, IFA: NEGATIVE

## 2022-04-19 LAB — RHEUMATOID FACTOR: Rheumatoid fact SerPl-aCnc: 19.2 IU/mL — ABNORMAL HIGH (ref <14.0)

## 2022-04-21 LAB — BCR-ABL1 FISH
Cells Analyzed: 200
Cells Counted: 200

## 2022-04-25 LAB — CALR +MPL + E12-E15  (REFLEX)

## 2022-04-25 LAB — JAK2 V617F RFX CALR/MPL/E12-15

## 2022-04-29 ENCOUNTER — Ambulatory Visit: Payer: Medicare HMO | Attending: Cardiology

## 2022-04-29 DIAGNOSIS — I739 Peripheral vascular disease, unspecified: Secondary | ICD-10-CM

## 2022-05-09 ENCOUNTER — Telehealth: Payer: Self-pay | Admitting: *Deleted

## 2022-05-09 NOTE — Telephone Encounter (Signed)
-----   Message from Arnoldo Lenis, MD sent at 05/09/2022 12:21 PM EST ----- Leg circulation test overall looks fine, should discuss other possible causes of symptoms with pcp  J BrancH MD

## 2022-05-09 NOTE — Telephone Encounter (Signed)
  Laurine Blazer, LPN 90/07/1113  5:20 PM EST Back to Top    Notified, copy to pcp.

## 2022-05-21 ENCOUNTER — Encounter: Payer: Self-pay | Admitting: Hematology

## 2022-05-21 ENCOUNTER — Inpatient Hospital Stay: Payer: Medicare HMO | Attending: Hematology | Admitting: Hematology

## 2022-05-21 VITALS — BP 172/64 | HR 53 | Temp 97.8°F | Resp 18 | Wt 177.9 lb

## 2022-05-21 DIAGNOSIS — Z882 Allergy status to sulfonamides status: Secondary | ICD-10-CM | POA: Diagnosis not present

## 2022-05-21 DIAGNOSIS — Z79899 Other long term (current) drug therapy: Secondary | ICD-10-CM | POA: Insufficient documentation

## 2022-05-21 DIAGNOSIS — M25552 Pain in left hip: Secondary | ICD-10-CM | POA: Insufficient documentation

## 2022-05-21 DIAGNOSIS — Z9049 Acquired absence of other specified parts of digestive tract: Secondary | ICD-10-CM | POA: Insufficient documentation

## 2022-05-21 DIAGNOSIS — D72828 Other elevated white blood cell count: Secondary | ICD-10-CM | POA: Diagnosis not present

## 2022-05-21 DIAGNOSIS — D729 Disorder of white blood cells, unspecified: Secondary | ICD-10-CM

## 2022-05-21 DIAGNOSIS — M25551 Pain in right hip: Secondary | ICD-10-CM | POA: Insufficient documentation

## 2022-05-21 DIAGNOSIS — D72825 Bandemia: Secondary | ICD-10-CM

## 2022-05-21 DIAGNOSIS — Z87891 Personal history of nicotine dependence: Secondary | ICD-10-CM | POA: Diagnosis not present

## 2022-05-21 DIAGNOSIS — M549 Dorsalgia, unspecified: Secondary | ICD-10-CM | POA: Insufficient documentation

## 2022-05-21 NOTE — Patient Instructions (Addendum)
[place]  Discharge Instructions  You were seen and examined today by Dr. Delton Coombes.  Dr. Delton Coombes discussed your most recent lab work which revealed that the leukemia test were all negative but your inflammation marker were elevated and this can cause your white blood cell count to be elevated.The rheumatoid arthritis factor was elevated.   Dr. Delton Coombes recommends just following this for now. We will do labs in 6 months.  Follow-up as scheduled in 6 months with labs.    Thank you for choosing [place] to provide your oncology and hematology care.   To afford each patient quality time with our provider, please arrive at least 15 minutes before your scheduled appointment time. You may need to reschedule your appointment if you arrive late (10 or more minutes). Arriving late affects you and other patients whose appointments are after yours.  Also, if you miss three or more appointments without notifying the office, you may be dismissed from the clinic at the provider's discretion.    Again, thank you for choosing [facility].  Our hope is that these requests will decrease the amount of time that you wait before being seen by our physicians.   If you have a lab appointment with the [place] please come in thru the Main Entrance and check in at the main information desk.           _____________________________________________________________  Should you have questions after your visit to [facility], please contact our office at [phone] and follow the prompts.  Our office hours are 8:00 a.m. to 4:30 p.m. Monday - Thursday and 8:00 a.m. to 2:30 p.m. Friday.  Please note that voicemails left after 4:00 p.m. may not be returned until the following business day.  We are closed weekends and all major holidays.  You do have access to a nurse 24-7, just call the main number to the clinic [phone]  and do not press any options, hold on the line and a nurse will answer the phone.    For prescription refill requests, have your pharmacy contact our office and allow 72 hours.    Masks are optional in the cancer centers. If you would like for your care team to wear a mask while they are taking care of you, please let them know. You may have one support person who is at least 86 years old accompany you for your appointments.

## 2022-05-21 NOTE — Progress Notes (Signed)
Kayla Noble, [place] [address]   CLINIC:  Medical Oncology/Hematology  PCP:  Luciano Cutter, DO [address] [place] [address] [phone]   REASON FOR VISIT:  Follow-up for JAK2 V617F and BCR/ABL negative leukocytosis  PRIOR THERAPY: None  CURRENT THERAPY: Observation  INTERVAL HISTORY:  Ms. Primo 88 y.o. female seen for follow-up of neutrophilic leukocytosis.  She reports back pain going down the posterior left thigh and some arthritic pains in the hips and knees and small joints of the hands for long time.  She takes Tylenol 3 occasionally.  She denies any recurrent infections or B symptoms.    REVIEW OF SYSTEMS:  Review of Systems  Musculoskeletal:  Positive for arthralgias.  All other systems reviewed and are negative.    PAST MEDICAL/SURGICAL HISTORY:  Past Medical History:  Diagnosis Date   Anxiety disorder, unspecified    Gastro-esophageal reflux disease without esophagitis    Hypertensive chronic kidney disease with stage 1 through stage 4 chronic kidney disease, or unspecified chronic kidney disease    Kidney disease, chronic, stage III (moderate, EGFR 30-59 ml/min) (HCC)    Low back pain    Major depressive disorder, single episode, unspecified    Mixed hyperlipidemia    Obesity, unspecified    Pain in left knee    Polyosteoarthritis, unspecified    Type 2 diabetes mellitus with diabetic nephropathy (HCC)    Past Surgical History:  Procedure Laterality Date   bilat foot surgery     CATARACT EXTRACTION     CHOLECYSTECTOMY     cspine surgery     KNEE SURGERY       SOCIAL HISTORY:  Social History   Socioeconomic History   Marital status: Married    Spouse name: Not on file   Number of children: Not on file   Years of education: Not on file   Highest education level: Not on file  Occupational History   Not on file  Tobacco Use   Smoking status: Former    Types: Cigarettes    Quit date: 06/02/1990     Years since quitting: 31.9    Passive exposure: Never   Smokeless tobacco: Never  Vaping Use   Vaping Use: Never used  Substance and Sexual Activity   Alcohol use: Never   Drug use: Never   Sexual activity: Not on file  Other Topics Concern   Not on file  Social History Narrative   Not on file   Social Determinants of Health   Financial Resource Strain: Not on file  Food Insecurity: No Food Insecurity (04/17/2022)   Hunger Vital Sign    Worried About Running Out of Food in the Last Year: Never true    Ran Out of Food in the Last Year: Never true  Transportation Needs: No Transportation Needs (04/17/2022)   PRAPARE - Hydrologist (Medical): No    Lack of Transportation (Non-Medical): No  Physical Activity: Not on file  Stress: Not on file  Social Connections: Not on file  Intimate Partner Violence: Not At Risk (04/17/2022)   Humiliation, Afraid, Rape, and Kick questionnaire    Fear of Current or Ex-Partner: No    Emotionally Abused: No    Physically Abused: No    Sexually Abused: No    FAMILY HISTORY:  History reviewed. No pertinent family history.  CURRENT MEDICATIONS:  Outpatient Encounter Medications as of 05/21/2022  Medication Sig   Acetaminophen-Codeine  300-30 MG tablet Take 1 tablet by mouth every 4 (four) hours as needed for pain.   ALPRAZolam (XANAX) 1 MG tablet Take 1 mg by mouth 3 (three) times daily as needed for anxiety.   amLODipine (NORVASC) 10 MG tablet Take 10 mg by mouth daily.   aspirin EC 81 MG tablet Take 81 mg by mouth daily.   carvedilol (COREG) 6.25 MG tablet Take 6.25 mg by mouth 2 (two) times daily with a meal.   Cholecalciferol (VITAMIN D-3 PO) Take 50 mcg by mouth every other day.   Docusate Calcium (STOOL SOFTENER PO) Take 1 tablet by mouth daily.   DULoxetine (CYMBALTA) 60 MG capsule Take 60 mg by mouth daily.   furosemide (LASIX) 40 MG tablet Take 40 mg by mouth 2 (two) times daily.   hydrALAZINE (APRESOLINE)  50 MG tablet Take 50 mg by mouth in the morning and at bedtime.   Insulin Detemir (LEVEMIR FLEXTOUCH) 100 UNIT/ML Pen Inject 50 Units into the skin daily.   LEADER UNIFINE PENTIPS PLUS 31G X 8 MM MISC USE WITH INSULIN DAILY   levothyroxine (SYNTHROID) 50 MCG tablet Take 50 mcg by mouth daily before breakfast.   linagliptin (TRADJENTA) 5 MG TABS tablet Take 5 mg by mouth daily.   Multiple Vitamin (MULTIVITAMIN) tablet Take 1 tablet by mouth daily.   QUEtiapine (SEROQUEL) 50 MG tablet Take 50 mg by mouth daily.   rosuvastatin (CRESTOR) 20 MG tablet Take 20 mg by mouth daily.   spironolactone (ALDACTONE) 25 MG tablet Take 12.5 mg by mouth daily.   [DISCONTINUED] calcitRIOL (ROCALTROL) 0.25 MCG capsule Take 0.25 mcg by mouth. Every Monday, Wednesday and friday   [DISCONTINUED] pioglitazone (ACTOS) 30 MG tablet Take 30 mg by mouth daily.   No facility-administered encounter medications on file as of 05/21/2022.    ALLERGIES:  Allergies  Allergen Reactions   Sulfa Antibiotics Itching and Swelling     PHYSICAL EXAM:  ECOG Performance status: 1  Vitals:   05/21/22 1515  BP: (!) 172/64  Pulse: (!) 53  Resp: 18  Temp: 97.8 F (36.6 C)  SpO2: 96%   Filed Weights   05/21/22 1515  Weight: 177 lb 14.4 oz (80.7 kg)   Physical Exam Vitals reviewed.  Constitutional:      Appearance: Normal appearance.  Cardiovascular:     Rate and Rhythm: Normal rate and regular rhythm.     Heart sounds: Normal heart sounds.  Pulmonary:     Effort: Pulmonary effort is normal.     Breath sounds: Normal breath sounds.  Neurological:     Mental Status: She is alert.  Psychiatric:        Mood and Affect: Mood normal.        Behavior: Behavior normal.      LABORATORY DATA:  I have reviewed the labs as listed.  CBC    Component Value Date/Time   WBC 13.9 (H) 04/17/2022 1416   RBC 4.26 04/17/2022 1416   HGB 12.2 04/17/2022 1416   HCT 36.3 04/17/2022 1416   PLT 340 04/17/2022 1416   MCV 85.2  04/17/2022 1416   MCH 28.6 04/17/2022 1416   MCHC 33.6 04/17/2022 1416   RDW 12.7 04/17/2022 1416   LYMPHSABS 2.3 04/17/2022 1416   MONOABS 0.9 04/17/2022 1416   EOSABS 0.3 04/17/2022 1416   BASOSABS 0.1 04/17/2022 1416      Latest Ref Rng & Units 02/24/2019   12:00 AM 05/24/2010   12:44 PM  CMP  Glucose  70 - 99 mg/dL  117   BUN 4 - _0 Creatinine 0.5 - 1.1 1.4     1.90   Sodium 137 - 147 140     135   Potassium 3.4 - 5.3 4.3     4.3   Chloride 99 - 108 102     100   CO2 13 - _1 Calcium 8.7 - 10.7 9.3     8.8   Total Protein 6.0 - 8.3 g/dL  6.1   Total Bilirubin 0.3 - 1.2 mg/dL  0.4   Alkaline Phos 39 - 117 U/L  42   AST 0 - 37 U/L  18   ALT 0 - 35 U/L  13      This result is from an external source.    DIAGNOSTIC IMAGING:  I have independently reviewed the scans and discussed with the patient.  ASSESSMENT:  1.  Neutrophilic leukocytosis: - Patient seen at the request of Dr. Theador Hawthorne - CBC (03/10/2022): WBC-14.1, Hb-12.7, PLT-378, ANC-10.3, AEC-0.5 - I have reviewed her previous labs.  Her white count has been elevated ranging between 11-14 K since January 2021. - She denies any fevers or night sweats.  Reports 20 pound weight loss in the last 6 months but was trying to lose weight by cutting out sugars.  She was also started on Lasix. - She had tooth infection x2 in the past 6 months and UTI twice a year. - Labs (04/17/2022): BCR/ABL by FISH negative.  JAK2 V617F with reflex testing negative.  Rheumatoid factor elevated at 19.2.  ANA negative.  ESR elevated at 58.   2.  Social/family history: - She lives with her husband at home.  She is accompanied by her daughter Herbert Spires who used to work in our oncology clinic as a Marine scientist. - She has trouble walking due to back issues and arthritis.  She is able to do all her ADLs.  Limited in some of the IADLs.  She worked for [employer].  Quit smoking 30 years ago but smoked 1 pack/day for 30 years. - No family  history of leukemia.  Paternal aunt had breast cancer.   PLAN:  1.  JAK2 V617F and BCR/ABL negative leukocytosis: - We reviewed results which was negative for myeloproliferative neoplasms. - However her ESR and rheumatoid factor were elevated. - Etiology of leukocytosis is reactive to neutrophilic leukocytosis. - Recommend follow-up in 6 months with repeat CBC, LDH, ESR and CRP. - No further workup needed at this time.  If there is any significant changes in her white count and other blood counts, will consider bone marrow aspiration and biopsy.      Orders placed this encounter:  Orders Placed This Encounter  Procedures   CBC with Differential/Platelet   Lactate dehydrogenase   Sedimentation rate   C-reactive protein      Derek Jack, MD [place] [phone]

## 2022-07-14 ENCOUNTER — Other Ambulatory Visit: Payer: Medicare HMO

## 2022-07-31 ENCOUNTER — Ambulatory Visit: Payer: Medicare HMO | Attending: Cardiology

## 2022-07-31 DIAGNOSIS — I350 Nonrheumatic aortic (valve) stenosis: Secondary | ICD-10-CM | POA: Diagnosis not present

## 2022-07-31 LAB — ECHOCARDIOGRAM COMPLETE
AR max vel: 0.99 cm2
AV Area VTI: 1.01 cm2
AV Area mean vel: 1.13 cm2
AV Mean grad: 30 mmHg
AV Peak grad: 53.1 mmHg
Ao pk vel: 3.64 m/s
Area-P 1/2: 2.39 cm2
Calc EF: 75.8 %
MV M vel: 4.71 m/s
MV Peak grad: 88.9 mmHg
S' Lateral: 2.3 cm
Single Plane A2C EF: 73 %
Single Plane A4C EF: 79.2 %

## 2022-08-04 ENCOUNTER — Telehealth: Payer: Self-pay | Admitting: *Deleted

## 2022-08-04 NOTE — Telephone Encounter (Signed)
Laurine Blazer, LPN [id number]  624THL PM EST Back to Top    Notified, copy to pcp.

## 2022-08-04 NOTE — Telephone Encounter (Signed)
-----   Message from Arnoldo Lenis, MD sent at 08/04/2022  9:59 AM EST ----- Echo shows normal heart pumping function. Aortic valve is moderately stiff (moderate aortic stenosis), just something to continue to monitor at this time  Zandra Abts MD

## 2022-10-23 ENCOUNTER — Ambulatory Visit: Payer: Medicare HMO | Admitting: Cardiology

## 2022-10-23 NOTE — Progress Notes (Deleted)
Clinical Summary Ms. Savidge is a 89 y.o.female  seen today for follow up of the following medical problems.    1.Aortic stenosis - 07/2021 echo LVEF 60-65%, no WMAs, grade I dd, AV mean grad 23, AVA VTI 0.94, DI 0.36, SVI 44 - uses walker at home, sedentary lifestyle. Limited by chronic back pain. For level of exertion she achieved denies significant symptoms of SOB or chest pains.    - no SOB/DOE, no chest pains.     07/2022 echoL LVEF 65-70%, mod AS mean grad 30, DI 0.4, AVA VTI 1.01   2. CKD IV - followed by Dr Wolfgang Phoenix nephrology     3. HTN - compliant with meds   - recent pcp visit 134/60     4. Chronic diastolic HF - diuretics per renal - no recent edmea   5. Leg pains - cramping pain bilateral thighs and calves with walking Past Medical History:  Diagnosis Date   Anxiety disorder, unspecified    Gastro-esophageal reflux disease without esophagitis    Hypertensive chronic kidney disease with stage 1 through stage 4 chronic kidney disease, or unspecified chronic kidney disease    Kidney disease, chronic, stage III (moderate, EGFR 30-59 ml/min) (HCC)    Low back pain    Major depressive disorder, single episode, unspecified    Mixed hyperlipidemia    Obesity, unspecified    Pain in left knee    Polyosteoarthritis, unspecified    Type 2 diabetes mellitus with diabetic nephropathy (HCC)      Allergies  Allergen Reactions   Sulfa Antibiotics Itching and Swelling     Current Outpatient Medications  Medication Sig Dispense Refill   Acetaminophen-Codeine 300-30 MG tablet Take 1 tablet by mouth every 4 (four) hours as needed for pain.     ALPRAZolam (XANAX) 1 MG tablet Take 1 mg by mouth 3 (three) times daily as needed for anxiety.     amLODipine (NORVASC) 10 MG tablet Take 10 mg by mouth daily.     aspirin EC 81 MG tablet Take 81 mg by mouth daily.     carvedilol (COREG) 6.25 MG tablet Take 6.25 mg by mouth 2 (two) times daily with a meal.      Cholecalciferol (VITAMIN D-3 PO) Take 50 mcg by mouth every other day.     Docusate Calcium (STOOL SOFTENER PO) Take 1 tablet by mouth daily.     DULoxetine (CYMBALTA) 60 MG capsule Take 60 mg by mouth daily.     furosemide (LASIX) 40 MG tablet Take 40 mg by mouth 2 (two) times daily.     hydrALAZINE (APRESOLINE) 50 MG tablet Take 50 mg by mouth in the morning and at bedtime.     Insulin Detemir (LEVEMIR FLEXTOUCH) 100 UNIT/ML Pen Inject 50 Units into the skin daily.     LEADER UNIFINE PENTIPS PLUS 31G X 8 MM MISC USE WITH INSULIN DAILY     levothyroxine (SYNTHROID) 50 MCG tablet Take 50 mcg by mouth daily before breakfast.     linagliptin (TRADJENTA) 5 MG TABS tablet Take 5 mg by mouth daily.     Multiple Vitamin (MULTIVITAMIN) tablet Take 1 tablet by mouth daily.     QUEtiapine (SEROQUEL) 50 MG tablet Take 50 mg by mouth daily.     rosuvastatin (CRESTOR) 20 MG tablet Take 20 mg by mouth daily.     spironolactone (ALDACTONE) 25 MG tablet Take 12.5 mg by mouth daily.     No current facility-administered  medications for this visit.     Past Surgical History:  Procedure Laterality Date   bilat foot surgery     CATARACT EXTRACTION     CHOLECYSTECTOMY     cspine surgery     KNEE SURGERY       Allergies  Allergen Reactions   Sulfa Antibiotics Itching and Swelling      No family history on file.   Social History Ms. Wurts reports that she quit smoking about 32 years ago. Her smoking use included cigarettes. She has never been exposed to tobacco smoke. She has never used smokeless tobacco. Ms. Skehan reports no history of alcohol use.   Review of Systems CONSTITUTIONAL: No weight loss, fever, chills, weakness or fatigue.  HEENT: Eyes: No visual loss, blurred vision, double vision or yellow sclerae.No hearing loss, sneezing, congestion, runny nose or sore throat.  SKIN: No rash or itching.  CARDIOVASCULAR:  RESPIRATORY: No shortness of breath, cough or sputum.   GASTROINTESTINAL: No anorexia, nausea, vomiting or diarrhea. No abdominal pain or blood.  GENITOURINARY: No burning on urination, no polyuria NEUROLOGICAL: No headache, dizziness, syncope, paralysis, ataxia, numbness or tingling in the extremities. No change in bowel or bladder control.  MUSCULOSKELETAL: No muscle, back pain, joint pain or stiffness.  LYMPHATICS: No enlarged nodes. No history of splenectomy.  PSYCHIATRIC: No history of depression or anxiety.  ENDOCRINOLOGIC: No reports of sweating, cold or heat intolerance. No polyuria or polydipsia.  Marland Kitchen   Physical Examination There were no vitals filed for this visit. There were no vitals filed for this visit.  Gen: resting comfortably, no acute distress HEENT: no scleral icterus, pupils equal round and reactive, no palptable cervical adenopathy,  CV Resp: Clear to auscultation bilaterally GI: abdomen is soft, non-tender, non-distended, normal bowel sounds, no hepatosplenomegaly MSK: extremities are warm, no edema.  Skin: warm, no rash Neuro:  no focal deficits Psych: appropriate affect   Diagnostic Studies  2D echo done on November 04, 2019 Left Ventricle: Left ventricular ejection fraction, by estimation, is 60 to 65%. The left ventricle has normal function. The left ventricle has no regional wall motion abnormalities. The left ventricular internal cavity size was normal in size. There is mild left ventricular hypertrophy. Left ventricular diastolic parameters are consistent with age-related delayed relaxation (normal). Elevated left ventricular end-diastolic pressure.   Aortic Valve: The aortic valve is tricuspid. . There is moderate thickening and moderate calcification of the aortic valve. Aortic valve regurgitation is not visualized. Mild aortic stenosis is present. There is moderate thickening of the aortic valve. There is moderate calcification of the aortic valve. Aortic valve mean gradient measures 9.7 mmHg. Aortic valve peak  gradient measures 17.0 mmHg. Aortic valve area, by VTI measures 1.26 cm.   2D echo done on July 11, 2021 1. Left ventricular ejection fraction, by estimation, is 60 to 65%. The left ventricle has normal function. The left ventricle has no regional wall motion abnormalities. There is mild left ventricular hypertrophy. Left ventricular diastolic parameters  are consistent with Grade I diastolic dysfunction (impaired relaxation).  2. Right ventricular systolic function is normal. The right ventricular size is normal. There is normal pulmonary artery systolic pressure.  3. The mitral valve is abnormal. Trivial mitral valve regurgitation. No evidence of mitral stenosis. Moderate mitral annular calcification.  4. Valve area 0.91 but DVI 0.36 Valve not imaged in basal short axis visible opening on PSLX AS has worsened since 11/11/19 mean gradient 9.7 to 22.7 and peak 17-> 38.8 mmHg .  The aortic valve is tricuspid. There is moderate calcification of the aortic  valve. There is moderate thickening of the aortic valve. Aortic valve regurgitation is trivial. Moderate aortic valve stenosis.  5. The inferior vena cava is normal in size with greater than 50% respiratory variability, suggesting right atrial pressure of 3 mmHg.    07/2022 echo 1. Left ventricular ejection fraction, by estimation, is 65 to 70%. The  left ventricle has normal function. The left ventricle has no regional  wall motion abnormalities. There is moderate concentric left ventricular  hypertrophy. Left ventricular  diastolic parameters are consistent with Grade I diastolic dysfunction  (impaired relaxation). The average left ventricular global longitudinal  strain is -21.6 %. The global longitudinal strain is normal.   2. Right ventricular systolic function is normal. The right ventricular  size is normal. There is normal pulmonary artery systolic pressure. The  estimated right ventricular systolic pressure is 20.1 mmHg.   3. The  mitral valve is degenerative. Mild mitral valve regurgitation.  Moderate mitral annular calcification.   4. The aortic valve is tricuspid. There is moderate calcification of the  aortic valve. Aortic valve regurgitation is trivial. Moderate aortic valve  stenosis. Aortic valve mean gradient measures 30.0 mmHg. Dimentionless  index 0.40.   5. The inferior vena cava is normal in size with greater than 50%  respiratory variability, suggesting right atrial pressure of 3 mmHg.     Assessment and Plan   Aortic stenosis -consensus of echo data supports moderate AS - no symptoms, repeat echo 07/2022   2. HFpEF - euvolemic today,we have deferred diuretics to neprhology given coeexisting CKD     3. HTN - at goal, continue current meds - essentially at goal, continue current meds   4.Leg pains - order ABIs     Antoine Poche, M.D., F.A.C.C.

## 2022-11-04 ENCOUNTER — Encounter: Payer: Self-pay | Admitting: Nurse Practitioner

## 2022-11-04 ENCOUNTER — Ambulatory Visit: Payer: Medicare HMO | Attending: Cardiology | Admitting: Nurse Practitioner

## 2022-11-04 VITALS — BP 158/70 | HR 63 | Ht 59.0 in | Wt 181.0 lb

## 2022-11-04 DIAGNOSIS — I5032 Chronic diastolic (congestive) heart failure: Secondary | ICD-10-CM | POA: Diagnosis not present

## 2022-11-04 DIAGNOSIS — E782 Mixed hyperlipidemia: Secondary | ICD-10-CM

## 2022-11-04 DIAGNOSIS — I10 Essential (primary) hypertension: Secondary | ICD-10-CM | POA: Diagnosis not present

## 2022-11-04 DIAGNOSIS — N184 Chronic kidney disease, stage 4 (severe): Secondary | ICD-10-CM

## 2022-11-04 DIAGNOSIS — I350 Nonrheumatic aortic (valve) stenosis: Secondary | ICD-10-CM

## 2022-11-04 DIAGNOSIS — E669 Obesity, unspecified: Secondary | ICD-10-CM

## 2022-11-04 MED ORDER — HYDRALAZINE HCL 100 MG PO TABS: 100.0000 mg | ORAL_TABLET | Freq: Two times a day (BID) | ORAL | 6 refills | Status: AC

## 2022-11-04 NOTE — Progress Notes (Unsigned)
Office Visit    Patient Name: Kayla Noble Date of Encounter: 11/04/2022  PCP:  Catalina Lunger, DO   [facility]  Cardiologist:  Dina Rich, MD *** Advanced Practice Provider:  No care team member to display Electrophysiologist:  None  {Press F2 to show EP APP, CHF, sleep or structural heart MD               :[id number]}  { Click here to update then REFRESH NOTE - MD (PCP) or APP (Team Member)  Change PCP Type for MD, Specialty for APP is either Cardiology or Clinical Cardiac Electrophysiology  :[id number]}  Chief Complaint    Kayla Noble is a 89 y.o. female with a hx of nonrheumatic aortic valve stenosis, hypertension, chronic diastolic CHF, CKD stage IV (followed by nephrology), type 2 diabetes, mixed hyperlipidemia, obesity, arthritis, and anxiety/depression, who presents today for 76-month follow-up.  Past Medical History    Past Medical History:  Diagnosis Date   Anxiety disorder, unspecified    Gastro-esophageal reflux disease without esophagitis    Hypertensive chronic kidney disease with stage 1 through stage 4 chronic kidney disease, or unspecified chronic kidney disease    Kidney disease, chronic, stage III (moderate, EGFR 30-59 ml/min) (HCC)    Low back pain    Major depressive disorder, single episode, unspecified    Mixed hyperlipidemia    Obesity, unspecified    Pain in left knee    Polyosteoarthritis, unspecified    Type 2 diabetes mellitus with diabetic nephropathy (HCC)    Past Surgical History:  Procedure Laterality Date   bilat foot surgery     CATARACT EXTRACTION     CHOLECYSTECTOMY     cspine surgery     KNEE SURGERY      Allergies  Allergies  Allergen Reactions   Sulfa Antibiotics Itching and Swelling    History of Present Illness    Kayla Noble is a 89 y.o. female with a PMH as mentioned above.  Echocardiogram in February 2023 revealed EF normal, grade 1 DD, moderate aortic valve stenosis.  Last  seen by Dr. Dina Rich on April 10, 2022.  She was doing well from a heart failure standpoint.  Was asymptomatic regarding her aortic valve stenosis.  Echocardiogram was arranged to be repeated in February 2024, revealed stable aortic valve stenosis with increased mean gradient from previous study measuring 30.0 mmHg. Patient did admit to some leg pains, ABIs were ordered, normal.   Today she presents for 55-month follow-up.  She states  EKGs/Labs/Other Studies Reviewed:   The following studies were reviewed today: ***  EKG:  EKG is *** ordered today.  The ekg ordered today demonstrates ***  Recent Labs: 04/17/2022: Hemoglobin 12.2; Platelets 340  Recent Lipid Panel    Component Value Date/Time   CHOL 237 (A) 02/24/2019 0000   TRIG 355 (A) 02/24/2019 0000   HDL 42 02/24/2019 0000   LDLCALC 140 02/24/2019 0000    Risk Assessment/Calculations:  {Does this patient have ATRIAL FIBRILLATION?:[phone]}  Home Medications   No outpatient medications have been marked as taking for the 11/04/22 encounter (Appointment) with Sharlene Dory, NP.     Review of Systems   ***   All other systems reviewed and are otherwise negative except as noted above.  Physical Exam    VS:  There were no vitals taken for this visit. , BMI There is no height or weight on file to calculate BMI.  Wt Readings from  Last 3 Encounters:  05/21/22 177 lb 14.4 oz (80.7 kg)  04/17/22 174 lb 14.4 oz (79.3 kg)  04/10/22 174 lb (78.9 kg)     GEN: Well nourished, well developed, in no acute distress. HEENT: normal. Neck: Supple, no JVD, carotid bruits, or masses. Cardiac: ***RRR, no murmurs, rubs, or gallops. No clubbing, cyanosis, edema.  ***Radials/PT 2+ and equal bilaterally.  Respiratory:  ***Respirations regular and unlabored, clear to auscultation bilaterally. GI: Soft, nontender, nondistended. MS: No deformity or atrophy. Skin: Warm and dry, no rash. Neuro:  Strength and sensation are  intact. Psych: Normal affect.  Assessment & Plan    ***  {Are you ordering a CV Procedure (e.g. stress test, cath, DCCV, TEE, etc)?   Press F2        :[id number]}      Disposition: Follow up {follow up:15908} with Dina Rich, MD or APP.  Signed, Sharlene Dory, NP 11/04/2022, 1:04 PM [facility]

## 2022-11-04 NOTE — Patient Instructions (Addendum)
Medication Instructions:   Increase Hydralazine to 100mg  twice a day   Continue all other medications.     Labwork:  none  Testing/Procedures:  Your physician has requested that you have an echocardiogram. Echocardiography is a painless test that uses sound waves to create images of your heart. It provides your doctor with information about the size and shape of your heart and how well your heart's chambers and valves are working. This procedure takes approximately one hour. There are no restrictions for this procedure. Please do NOT wear cologne, perfume, aftershave, or lotions (deodorant is allowed). Please arrive 15 minutes prior to your appointment time.  DUE FEBRUARY 2025  Follow-Up:  6 months   Any Other Special Instructions Will Be Listed Below (If Applicable).  Salty six BP log   If you need a refill on your cardiac medications before your next appointment, please call your pharmacy.

## 2022-11-20 ENCOUNTER — Inpatient Hospital Stay: Payer: Medicare HMO | Attending: Hematology

## 2022-11-20 ENCOUNTER — Other Ambulatory Visit: Payer: Medicare HMO

## 2022-11-20 DIAGNOSIS — D72825 Bandemia: Secondary | ICD-10-CM | POA: Insufficient documentation

## 2022-11-20 DIAGNOSIS — D729 Disorder of white blood cells, unspecified: Secondary | ICD-10-CM | POA: Diagnosis present

## 2022-11-20 LAB — CBC WITH DIFFERENTIAL/PLATELET
Abs Immature Granulocytes: 0.04 10*3/uL (ref 0.00–0.07)
Basophils Absolute: 0.1 10*3/uL (ref 0.0–0.1)
Basophils Relative: 1 %
Eosinophils Absolute: 0.3 10*3/uL (ref 0.0–0.5)
Eosinophils Relative: 3 %
HCT: 35.1 % — ABNORMAL LOW (ref 36.0–46.0)
Hemoglobin: 11.8 g/dL — ABNORMAL LOW (ref 12.0–15.0)
Immature Granulocytes: 0 %
Lymphocytes Relative: 16 %
Lymphs Abs: 1.9 10*3/uL (ref 0.7–4.0)
MCH: 28.3 pg (ref 26.0–34.0)
MCHC: 33.6 g/dL (ref 30.0–36.0)
MCV: 84.2 fL (ref 80.0–100.0)
Monocytes Absolute: 0.8 10*3/uL (ref 0.1–1.0)
Monocytes Relative: 7 %
Neutro Abs: 9.1 10*3/uL — ABNORMAL HIGH (ref 1.7–7.7)
Neutrophils Relative %: 73 %
Platelets: 314 10*3/uL (ref 150–400)
RBC: 4.17 MIL/uL (ref 3.87–5.11)
RDW: 12.8 % (ref 11.5–15.5)
WBC: 12.3 10*3/uL — ABNORMAL HIGH (ref 4.0–10.5)
nRBC: 0 % (ref 0.0–0.2)

## 2022-11-20 LAB — LACTATE DEHYDROGENASE: LDH: 211 U/L — ABNORMAL HIGH (ref 98–192)

## 2022-11-20 LAB — C-REACTIVE PROTEIN: CRP: 1.2 mg/dL — ABNORMAL HIGH (ref <1.0)

## 2022-11-20 LAB — SEDIMENTATION RATE: Sed Rate: 53 mm/hr — ABNORMAL HIGH (ref 0–22)

## 2022-11-27 ENCOUNTER — Ambulatory Visit: Payer: Medicare HMO | Admitting: Hematology

## 2022-12-10 ENCOUNTER — Inpatient Hospital Stay: Payer: Medicare HMO | Admitting: Hematology

## 2022-12-10 ENCOUNTER — Inpatient Hospital Stay: Payer: Medicare HMO | Attending: Oncology | Admitting: Oncology

## 2022-12-10 ENCOUNTER — Encounter: Payer: Self-pay | Admitting: Oncology

## 2022-12-10 VITALS — BP 157/54 | HR 56 | Temp 98.0°F | Resp 18 | Wt 181.4 lb

## 2022-12-10 DIAGNOSIS — N184 Chronic kidney disease, stage 4 (severe): Secondary | ICD-10-CM | POA: Diagnosis not present

## 2022-12-10 DIAGNOSIS — Z882 Allergy status to sulfonamides status: Secondary | ICD-10-CM | POA: Diagnosis not present

## 2022-12-10 DIAGNOSIS — D72825 Bandemia: Secondary | ICD-10-CM | POA: Diagnosis not present

## 2022-12-10 DIAGNOSIS — Z87891 Personal history of nicotine dependence: Secondary | ICD-10-CM | POA: Insufficient documentation

## 2022-12-10 DIAGNOSIS — I350 Nonrheumatic aortic (valve) stenosis: Secondary | ICD-10-CM | POA: Diagnosis not present

## 2022-12-10 DIAGNOSIS — D72828 Other elevated white blood cell count: Secondary | ICD-10-CM | POA: Insufficient documentation

## 2022-12-10 DIAGNOSIS — M2550 Pain in unspecified joint: Secondary | ICD-10-CM | POA: Insufficient documentation

## 2022-12-10 DIAGNOSIS — I5032 Chronic diastolic (congestive) heart failure: Secondary | ICD-10-CM | POA: Diagnosis not present

## 2022-12-10 DIAGNOSIS — E782 Mixed hyperlipidemia: Secondary | ICD-10-CM | POA: Diagnosis not present

## 2022-12-10 DIAGNOSIS — E669 Obesity, unspecified: Secondary | ICD-10-CM | POA: Insufficient documentation

## 2022-12-10 DIAGNOSIS — Z9049 Acquired absence of other specified parts of digestive tract: Secondary | ICD-10-CM | POA: Diagnosis not present

## 2022-12-10 DIAGNOSIS — Z79899 Other long term (current) drug therapy: Secondary | ICD-10-CM | POA: Diagnosis not present

## 2022-12-10 DIAGNOSIS — E1122 Type 2 diabetes mellitus with diabetic chronic kidney disease: Secondary | ICD-10-CM | POA: Diagnosis not present

## 2022-12-10 DIAGNOSIS — I130 Hypertensive heart and chronic kidney disease with heart failure and stage 1 through stage 4 chronic kidney disease, or unspecified chronic kidney disease: Secondary | ICD-10-CM | POA: Insufficient documentation

## 2022-12-10 NOTE — Progress Notes (Signed)
[facility] [address], [place] [address]   CLINIC:  Medical Oncology/Hematology  PCP:  Catalina Lunger, DO [address] [place] [address] [phone]   REASON FOR VISIT:  Follow-up for JAK2 V617F and BCR/ABL negative leukocytosis  PRIOR THERAPY: None  CURRENT THERAPY: Observation  INTERVAL HISTORY:  Ms. Catarino 89 y.o. female seen for follow-up of neutrophilic leukocytosis.  She was last seen in clinic on 05/21/2022 by Dr. Ellin Saba.  In the interim, she has done well.  She is followed by cardiology recently had 4 chronic diastolic CHF, nonrheumatic aortic valve stenosis, hypertension, CKD lipidemia and obesity.  She denies any recent infections or B symptoms.  Reports joint pain mainly in her back and leg that is worse first thing in the morning.  States sometimes she needs her husband to help her get up and moving.  She takes Tylenol 3 occasionally.   REVIEW OF SYSTEMS:  Review of Systems  Musculoskeletal:  Positive for arthralgias.     PAST MEDICAL/SURGICAL HISTORY:  Past Medical History:  Diagnosis Date   Anxiety disorder, unspecified    Gastro-esophageal reflux disease without esophagitis    Hypertensive chronic kidney disease with stage 1 through stage 4 chronic kidney disease, or unspecified chronic kidney disease    Kidney disease, chronic, stage III (moderate, EGFR 30-59 ml/min) (HCC)    Low back pain    Major depressive disorder, single episode, unspecified    Mixed hyperlipidemia    Obesity, unspecified    Pain in left knee    Polyosteoarthritis, unspecified    Type 2 diabetes mellitus with diabetic nephropathy (HCC)    Past Surgical History:  Procedure Laterality Date   bilat foot surgery     CATARACT EXTRACTION     CHOLECYSTECTOMY     cspine surgery     KNEE SURGERY       SOCIAL HISTORY:  Social History   Socioeconomic History   Marital status: Married    Spouse name: Not on file   Number of children: Not on file    Years of education: Not on file   Highest education level: Not on file  Occupational History   Not on file  Tobacco Use   Smoking status: Former    Types: Cigarettes    Quit date: 06/02/1990    Years since quitting: 32.5    Passive exposure: Never   Smokeless tobacco: Never  Vaping Use   Vaping Use: Never used  Substance and Sexual Activity   Alcohol use: Never   Drug use: Never   Sexual activity: Not on file  Other Topics Concern   Not on file  Social History Narrative   Not on file   Social Determinants of Health   Financial Resource Strain: Not on file  Food Insecurity: No Food Insecurity (04/17/2022)   Hunger Vital Sign    Worried About Running Out of Food in the Last Year: Never true    Ran Out of Food in the Last Year: Never true  Transportation Needs: No Transportation Needs (04/17/2022)   PRAPARE - Administrator, Civil Service (Medical): No    Lack of Transportation (Non-Medical): No  Physical Activity: Not on file  Stress: Not on file  Social Connections: Not on file  Intimate Partner Violence: Not At Risk (04/17/2022)   Humiliation, Afraid, Rape, and Kick questionnaire    Fear of Current or Ex-Partner: No    Emotionally Abused: No    Physically  Abused: No    Sexually Abused: No    FAMILY HISTORY:  History reviewed. No pertinent family history.  CURRENT MEDICATIONS:  Outpatient Encounter Medications as of 12/10/2022  Medication Sig   Acetaminophen-Codeine 300-30 MG tablet Take 1 tablet by mouth every 4 (four) hours as needed for pain.   ALPRAZolam (XANAX) 1 MG tablet Take 1 mg by mouth 3 (three) times daily as needed for anxiety.   amLODipine (NORVASC) 10 MG tablet Take 10 mg by mouth daily.   aspirin EC 81 MG tablet Take 81 mg by mouth daily.   carvedilol (COREG) 6.25 MG tablet Take 6.25 mg by mouth 2 (two) times daily with a meal.   Cholecalciferol (VITAMIN D-3 PO) Take 50 mcg by mouth every other day.   Docusate Calcium (STOOL SOFTENER  PO) Take 1 tablet by mouth in the morning and at bedtime.   DULoxetine (CYMBALTA) 60 MG capsule Take 60 mg by mouth daily.   furosemide (LASIX) 40 MG tablet Take 40 mg by mouth 2 (two) times daily.   hydrALAZINE (APRESOLINE) 100 MG tablet Take 1 tablet (100 mg total) by mouth 2 (two) times daily.   Insulin Detemir (LEVEMIR FLEXTOUCH) 100 UNIT/ML Pen Inject 50 Units into the skin daily.   LEADER UNIFINE PENTIPS PLUS 31G X 8 MM MISC USE WITH INSULIN DAILY   levothyroxine (SYNTHROID) 50 MCG tablet Take 50 mcg by mouth daily before breakfast.   linagliptin (TRADJENTA) 5 MG TABS tablet Take 5 mg by mouth daily.   Multiple Vitamin (MULTIVITAMIN) tablet Take 1 tablet by mouth daily.   QUEtiapine (SEROQUEL) 50 MG tablet Take 50 mg by mouth daily.   rosuvastatin (CRESTOR) 20 MG tablet Take 20 mg by mouth daily.   spironolactone (ALDACTONE) 25 MG tablet Take 12.5 mg by mouth daily.   No facility-administered encounter medications on file as of 12/10/2022.    ALLERGIES:  Allergies  Allergen Reactions   Sulfa Antibiotics Itching and Swelling     PHYSICAL EXAM:  ECOG Performance status: 1  Vitals:   12/10/22 1508 12/10/22 1514  BP: (!) 169/46 (!) 157/54  Pulse: (!) 56   Resp: 18   Temp: 98 F (36.7 C)   SpO2: 96%    Filed Weights   12/10/22 1508  Weight: 181 lb 6.4 oz (82.3 kg)   Physical Exam Constitutional:      Appearance: Normal appearance. She is obese.  HENT:     Head: Normocephalic and atraumatic.  Eyes:     Pupils: Pupils are equal, round, and reactive to light.  Cardiovascular:     Rate and Rhythm: Normal rate and regular rhythm.     Heart sounds: Normal heart sounds. No murmur heard. Pulmonary:     Effort: Pulmonary effort is normal.     Breath sounds: Normal breath sounds. No wheezing.  Abdominal:     General: Bowel sounds are normal. There is no distension.     Palpations: Abdomen is soft.     Tenderness: There is no abdominal tenderness.  Musculoskeletal:         General: Normal range of motion.     Cervical back: Normal range of motion.  Skin:    General: Skin is warm and dry.     Findings: No rash.  Neurological:     Mental Status: She is alert and oriented to person, place, and time.  Psychiatric:        Judgment: Judgment normal.      LABORATORY DATA:  I  have reviewed the labs as listed.  CBC    Component Value Date/Time   WBC 12.3 (H) 11/20/2022 1442   RBC 4.17 11/20/2022 1442   HGB 11.8 (L) 11/20/2022 1442   HCT 35.1 (L) 11/20/2022 1442   PLT 314 11/20/2022 1442   MCV 84.2 11/20/2022 1442   MCH 28.3 11/20/2022 1442   MCHC 33.6 11/20/2022 1442   RDW 12.8 11/20/2022 1442   LYMPHSABS 1.9 11/20/2022 1442   MONOABS 0.8 11/20/2022 1442   EOSABS 0.3 11/20/2022 1442   BASOSABS 0.1 11/20/2022 1442      Latest Ref Rng & Units 02/24/2019   12:00 AM 05/24/2010   12:44 PM  CMP  Glucose 70 - 99 mg/dL  161   BUN 4 - 21 17     29    Creatinine 0.5 - 1.1 1.4     1.90   Sodium 137 - 147 140     135   Potassium 3.4 - 5.3 4.3     4.3   Chloride 99 - 108 102     100   CO2 13 - 22 30     27    Calcium 8.7 - 10.7 9.3     8.8   Total Protein 6.0 - 8.3 g/dL  6.1   Total Bilirubin 0.3 - 1.2 mg/dL  0.4   Alkaline Phos 39 - 117 U/L  42   AST 0 - 37 U/L  18   ALT 0 - 35 U/L  13      This result is from an external source.    DIAGNOSTIC IMAGING:  I have independently reviewed the scans and discussed with the patient.  ASSESSMENT:  1.  Neutrophilic leukocytosis: - Patient seen at the request of Dr. Wolfgang Phoenix - CBC (03/10/2022): WBC-14.1, Hb-12.7, PLT-378, ANC-10.3, AEC-0.5 - I have reviewed her previous labs.  Her white count has been elevated ranging between 11-14 K since January 2021. - She denies any fevers or night sweats.  Reports 20 pound weight loss in the last 6 months but was trying to lose weight by cutting out sugars.  She was also started on Lasix. - She had tooth infection x2 in the past 6 months and UTI twice a year. - Labs  (04/17/2022): BCR/ABL by FISH negative.  JAK2 V617F with reflex testing negative.  Rheumatoid factor elevated at 19.2.  ANA negative.  ESR elevated at 58.   2.  Social/family history: - She lives with her husband at home.  She is accompanied by her daughter Delorise Jackson who used to work in our oncology clinic as a Engineer, civil (consulting). - She has trouble walking due to back issues and arthritis.  She is able to do all her ADLs.  Limited in some of the IADLs.  She worked for Engineer, site.  Quit smoking 30 years ago but smoked 1 pack/day for 30 years. - No family history of leukemia.  Paternal aunt had breast cancer.   PLAN:  1.  JAK2 V617F and BCR/ABL negative leukocytosis: - Lab work from 04/17/2020 negative for myeloproliferative neoplasm although her ESR and CRP were elevated. -Etiology of leukocytosis is likely reactive. -Lab work from 11/20/2022 shows persistent stable elevation of LDH, CRP and sed rate. -Given stability of lab work, would recommend follow-up in 6 months.  Discussed case with Dr. Ellin Saba who recommends continued follow-up of her blood counts. If there is a significant changes would recommend bone marrow biopsy.  -Patient is not interested in the bone marrow biopsy.  2.  Worsening joint pain: -Previous lab work indicated a positive rheumatoid factor.  ANA was negative. -Given worsening of joint pain, would like a referral to a rheumatologist for further evaluation.  PLAN SUMMARY: >> Recommend follow-up in 6 months with labs and to see APP/MD. labs a few days before. >> Referral to rheumatology.     I spent 20 minutes dedicated to the care of this patient (face-to-face and non-face-to-face) on the date of the encounter to include what is described in the assessment and plan.  Orders placed this encounter:  Orders Placed This Encounter  Procedures   Ambulatory referral to Rheumatology    Durenda Hurt, NP 12/10/2022 3:37 PM

## 2023-01-20 ENCOUNTER — Other Ambulatory Visit: Payer: Self-pay

## 2023-05-12 ENCOUNTER — Ambulatory Visit: Payer: Medicare HMO | Admitting: Cardiology

## 2023-05-13 ENCOUNTER — Ambulatory Visit: Payer: Medicare HMO | Admitting: Cardiology

## 2023-06-11 ENCOUNTER — Inpatient Hospital Stay: Payer: Medicare HMO

## 2023-06-18 ENCOUNTER — Inpatient Hospital Stay: Payer: Medicare HMO | Admitting: Oncology

## 2023-06-22 ENCOUNTER — Encounter: Payer: Self-pay | Admitting: Nurse Practitioner

## 2023-06-22 ENCOUNTER — Ambulatory Visit: Payer: Medicare HMO | Attending: Nurse Practitioner | Admitting: Nurse Practitioner

## 2023-06-22 VITALS — BP 122/70 | HR 58 | Ht 59.0 in | Wt 181.0 lb

## 2023-06-22 DIAGNOSIS — I10 Essential (primary) hypertension: Secondary | ICD-10-CM | POA: Diagnosis not present

## 2023-06-22 DIAGNOSIS — I5032 Chronic diastolic (congestive) heart failure: Secondary | ICD-10-CM

## 2023-06-22 DIAGNOSIS — I350 Nonrheumatic aortic (valve) stenosis: Secondary | ICD-10-CM

## 2023-06-22 DIAGNOSIS — N184 Chronic kidney disease, stage 4 (severe): Secondary | ICD-10-CM

## 2023-06-22 DIAGNOSIS — E669 Obesity, unspecified: Secondary | ICD-10-CM

## 2023-06-22 DIAGNOSIS — E782 Mixed hyperlipidemia: Secondary | ICD-10-CM

## 2023-06-22 NOTE — Patient Instructions (Addendum)

## 2023-06-22 NOTE — Progress Notes (Unsigned)
Office Visit    Patient Name: Kayla Noble Date of Encounter: 11/04/2022  PCP:  Catalina Lunger, DO   [facility]  Cardiologist:  Dina Rich, MD  Advanced Practice Provider:  No care team member to display Electrophysiologist:  None   Chief Complaint    Kayla Noble is a 89 y.o. female with a hx of nonrheumatic aortic valve stenosis, hypertension, chronic diastolic CHF, CKD stage IV (followed by nephrology), type 2 diabetes, mixed hyperlipidemia, obesity, arthritis, and anxiety/depression, who presents today for 63-month follow-up.  Past Medical History    Past Medical History:  Diagnosis Date   Anxiety disorder, unspecified    Gastro-esophageal reflux disease without esophagitis    Hypertensive chronic kidney disease with stage 1 through stage 4 chronic kidney disease, or unspecified chronic kidney disease    Kidney disease, chronic, stage III (moderate, EGFR 30-59 ml/min) (HCC)    Low back pain    Major depressive disorder, single episode, unspecified    Mixed hyperlipidemia    Obesity, unspecified    Pain in left knee    Polyosteoarthritis, unspecified    Type 2 diabetes mellitus with diabetic nephropathy (HCC)    Past Surgical History:  Procedure Laterality Date   bilat foot surgery     CATARACT EXTRACTION     CHOLECYSTECTOMY     cspine surgery     KNEE SURGERY      Allergies  Allergies  Allergen Reactions   Sulfa Antibiotics Itching and Swelling    History of Present Illness    Kayla Noble is a 89 y.o. female with a PMH as mentioned above.  Echocardiogram in February 2023 revealed EF normal, grade 1 DD, moderate aortic valve stenosis.  Last seen by Dr. Dina Rich on April 10, 2022.  She was doing well from a heart failure standpoint.  Was asymptomatic regarding her aortic valve stenosis.  Echocardiogram was arranged to be repeated in February 2024, revealed stable aortic valve stenosis with increased mean  gradient from previous study measuring 30.0 mmHg. Patient did admit to some leg pains, ABIs were ordered, normal.   Today she presents for 56-month follow-up.  She states she is doing well from a cardiac perspective. Does have significant arthritis. Her BP is elevated, and she says Dr. Wolfgang Phoenix recently adjusted one of her medications, requested that I speak with her daughter on the phone during visit (daughter is a former Charity fundraiser). Denies any chest pain, shortness of breath, palpitations, syncope, presyncope, dizziness, orthopnea, PND, swelling or significant weight changes, acute bleeding, or claudication. When reviewing her BP medications with her daughter, she states that Dr. Wolfgang Phoenix increased her Hydralazine to 100 mg BID, however daughter Delorise Jackson (listed on DPR) stated she was only taking 50 mg at night, but will begin giving her 100 mg BID.   EKGs/Labs/Other Studies Reviewed:   The following studies were reviewed today:   EKG:  EKG is not ordered today.  Echo 07/2022:  1. Left ventricular ejection fraction, by estimation, is 65 to 70%. The  left ventricle has normal function. The left ventricle has no regional  wall motion abnormalities. There is moderate concentric left ventricular  hypertrophy. Left ventricular  diastolic parameters are consistent with Grade I diastolic dysfunction  (impaired relaxation). The average left ventricular global longitudinal  strain is -21.6 %. The global longitudinal strain is normal.   2. Right ventricular systolic function is normal. The right ventricular  size is normal. There is normal pulmonary artery  systolic pressure. The  estimated right ventricular systolic pressure is 20.1 mmHg.   3. The mitral valve is degenerative. Mild mitral valve regurgitation.  Moderate mitral annular calcification.   4. The aortic valve is tricuspid. There is moderate calcification of the  aortic valve. Aortic valve regurgitation is trivial. Moderate aortic valve  stenosis.  Aortic valve mean gradient measures 30.0 mmHg. Dimentionless  index 0.40.   5. The inferior vena cava is normal in size with greater than 50%  respiratory variability, suggesting right atrial pressure of 3 mmHg.   Comparison(s): Prior images reviewed side by side. Moderate aortic  stenosis with increased AV mean gradient of 30 mmHg from 22.7 mmHg  previously.  Recent Labs: 11/20/2022: Hemoglobin 11.8; Platelets 314  Recent Lipid Panel    Component Value Date/Time   CHOL 237 (A) 02/24/2019 0000   TRIG 355 (A) 02/24/2019 0000   HDL 42 02/24/2019 0000   LDLCALC 140 02/24/2019 0000    Home Medications   No outpatient medications have been marked as taking for the 06/22/23 encounter (Appointment) with Sharlene Dory, NP.     Review of Systems    All other systems reviewed and are otherwise negative except as noted above.  Physical Exam    VS:  There were no vitals taken for this visit. , BMI There is no height or weight on file to calculate BMI.  Wt Readings from Last 3 Encounters:  12/10/22 181 lb 6.4 oz (82.3 kg)  11/04/22 181 lb (82.1 kg)  05/21/22 177 lb 14.4 oz (80.7 kg)    Repeat BP: 157/78  GEN: Obese, 89 y.o. female in no acute distress. HEENT: normal. Neck: Supple, no JVD, carotid bruits, or masses. Cardiac: S1/S2, RRR, no murmurs, rubs, or gallops. No clubbing, cyanosis, edema.  Radials/PT 2+ and equal bilaterally.  Respiratory:  Respirations regular and unlabored, clear to auscultation bilaterally. MS: No deformity or atrophy. Skin: Warm and dry, no rash. Neuro:  Strength and sensation are intact. Psych: Normal affect.  Assessment & Plan    Chronic diastolic CHF Stage C, NYHA class I-II symptoms. Echo 07/2022 showed EF 65-70%. Euvolemic and well compensated on exam. Continue Coreg, Lasix, Hydralazine, and Aldactone. Discussed how to take Hydralazine (100 mg BID)-see below. Low sodium diet, fluid restriction <2L, and daily weights encouraged. Educated to  contact our office for weight gain of 2 lbs overnight or 5 lbs in one week.  Non rheumatic aortic valve stenosis Echo 07/2022 showed moderate aortic valve stenosis, asymptomatic. Encouraged adequate hydration. ED precautions discussed. No medication changes at this time. Will repeat Echo in 1 year for monitoring to be repeated 07/2023.   HTN BP on arrival 158/70, repeat 157/78. Goal SBP < 140.  Continue amlodipine, carvedilol, Aldactone, and hydralazine.  Discussed to continue hydralazine per Dr. Lucio Edward recommendation which is 100 mg twice daily.  Daughter verbalized understanding. Discussed to monitor BP at home at least 2 hours after medications and sitting for 5-10 minutes.  GDMT limited by kidney disease-see below.  If blood pressure not at goal for future office visit, consider initiating Bidil. Heart heathy diet encouraged.   CKD stage IV Continue follow-up with nephrology as scheduled. Appreciate recs.   Mixed HLD No recent labs on file. Managed by PCP. Will request labs at next OV. Continue Crestor. Heart healthy diet and regular cardiovascular exercise encouraged.   Obesity BMI 36.56. Weight loss via diet and exercise encouraged. Discussed the impact being overweight would have on cardiovascular risk.  Disposition: Follow up  in 6 month(s) with Dina Rich, MD or APP.  Signed, Sharlene Dory, NP 06/22/2023, 1:40 PM [facility]

## 2023-06-24 ENCOUNTER — Telehealth: Payer: Self-pay | Admitting: Nurse Practitioner

## 2023-06-24 NOTE — Telephone Encounter (Signed)
Spoke with pt and mailed copy of local PCP.

## 2023-06-24 NOTE — Telephone Encounter (Signed)
Pt requesting cb from [employer] regarding a referral they discussed at ov on Monday.

## 2023-12-24 ENCOUNTER — Encounter: Payer: Self-pay | Admitting: Nurse Practitioner

## 2023-12-24 ENCOUNTER — Ambulatory Visit: Payer: Medicare HMO | Attending: Nurse Practitioner | Admitting: Nurse Practitioner

## 2023-12-24 VITALS — BP 151/62 | HR 59 | Ht 59.0 in | Wt 182.0 lb

## 2023-12-24 DIAGNOSIS — E669 Obesity, unspecified: Secondary | ICD-10-CM

## 2023-12-24 DIAGNOSIS — I350 Nonrheumatic aortic (valve) stenosis: Secondary | ICD-10-CM

## 2023-12-24 DIAGNOSIS — N184 Chronic kidney disease, stage 4 (severe): Secondary | ICD-10-CM | POA: Diagnosis not present

## 2023-12-24 DIAGNOSIS — I5032 Chronic diastolic (congestive) heart failure: Secondary | ICD-10-CM | POA: Diagnosis not present

## 2023-12-24 DIAGNOSIS — E782 Mixed hyperlipidemia: Secondary | ICD-10-CM

## 2023-12-24 DIAGNOSIS — I10 Essential (primary) hypertension: Secondary | ICD-10-CM | POA: Diagnosis not present

## 2023-12-24 NOTE — Progress Notes (Signed)
 Office Visit    Patient Name: Kayla Noble Date of Encounter: 12/24/2023 PCP:  Tobie Guy, DO [facility]  Cardiologist:  Alvan Carrier, MD  Advanced Practice Provider:  No care team member to display Electrophysiologist:  None   Chief Complaint and HPI    Kayla Noble is a 89 y.o. female with a hx of nonrheumatic aortic valve stenosis, hypertension, chronic diastolic CHF, CKD stage IV (followed by nephrology), type 2 diabetes, mixed hyperlipidemia, obesity, arthritis, and anxiety/depression, who presents today for 11-month follow-up.  Echocardiogram in February 2023 revealed EF normal, grade 1 DD, moderate aortic valve stenosis.  Last seen by Dr. Carrier Alvan on April 10, 2022.  She was doing well from a heart failure standpoint.  Was asymptomatic regarding her aortic valve stenosis.  Echocardiogram was arranged to be repeated in February 2024, revealed stable aortic valve stenosis with increased mean gradient from previous study measuring 30.0 mmHg. Patient did admit to some leg pains, ABIs were ordered, normal.   Last seen for 6 month follow-up on November 04, 2022. Was doing well from a cardiac perspective.   06/22/2023 - Today she presents for follow-up. Tells me she is having to use her walker more consistently d/t her back issues, shares she has a bulging disc. Denies any chest pain, shortness of breath, palpitations, syncope, presyncope, dizziness, orthopnea, PND, swelling or significant weight changes, acute bleeding, or claudication.   12/24/2023 -  Here for follow-up. Doing well. Weights are stable. Will be seeing Dr. Rachele next month. Denies any chest pain, shortness of breath, palpitations, syncope, presyncope, dizziness, orthopnea, PND, swelling or significant weight changes, acute bleeding, or claudication.   ROS: Negative. See HPI.  SH: One daughter is Interior and spatial designer of Imaging at [employer]), other daughter is a former  Nurse, learning disability, about to retire.   EKGs/Labs/Other Studies Reviewed:   The following studies were reviewed today:   EKG:  EKG is not ordered today.       Echo 07/2022:  1. Left ventricular ejection fraction, by estimation, is 65 to 70%. The  left ventricle has normal function. The left ventricle has no regional  wall motion abnormalities. There is moderate concentric left ventricular  hypertrophy. Left ventricular  diastolic parameters are consistent with Grade I diastolic dysfunction  (impaired relaxation). The average left ventricular global longitudinal  strain is -21.6 %. The global longitudinal strain is normal.   2. Right ventricular systolic function is normal. The right ventricular  size is normal. There is normal pulmonary artery systolic pressure. The  estimated right ventricular systolic pressure is 20.1 mmHg.   3. The mitral valve is degenerative. Mild mitral valve regurgitation.  Moderate mitral annular calcification.   4. The aortic valve is tricuspid. There is moderate calcification of the  aortic valve. Aortic valve regurgitation is trivial. Moderate aortic valve  stenosis. Aortic valve mean gradient measures 30.0 mmHg. Dimentionless  index 0.40.   5. The inferior vena cava is normal in size with greater than 50%  respiratory variability, suggesting right atrial pressure of 3 mmHg.   Comparison(s): Prior images reviewed side by side. Moderate aortic  stenosis with increased AV mean gradient of 30 mmHg from 22.7 mmHg  previously.  Review of Systems    All other systems reviewed and are otherwise negative except as noted above.  Physical Exam    VS:  BP (!) 142/60   Pulse (!) 59   Ht 4' 11 (1.499 m)   Wt  182 lb (82.6 kg)   SpO2 93%   BMI 36.76 kg/m  , BMI Body mass index is 36.76 kg/m.  Wt Readings from Last 3 Encounters:  12/24/23 182 lb (82.6 kg)  06/22/23 181 lb (82.1 kg)  12/10/22 181 lb 6.4 oz (82.3 kg)   GEN: Obese, 89 y.o. female in no acute  distress. HEENT: normal. Neck: Supple, no JVD, carotid bruits, or masses. Cardiac: S1/S2, RRR, Grade 2/6 murmur, no rubs or gallops. No clubbing, cyanosis, edema.  Radials/PT 2+ and equal bilaterally.  Respiratory:  Respirations regular and unlabored, clear to auscultation bilaterally. MS: No deformity or atrophy. Skin: Warm and dry, no rash. Neuro:  Strength and sensation are intact. Psych: Normal affect.  Assessment & Plan    Chronic diastolic CHF Stage C, NYHA class I-II symptoms. Echo 07/2022 showed EF 65-70%. Euvolemic and well compensated on exam. Continue current medication regimen. Low sodium diet, fluid restriction <2L, and daily weights encouraged. Educated to contact our office for weight gain of 2 lbs overnight or 5 lbs in one week.  Non rheumatic aortic valve stenosis Echo 07/2022 showed moderate aortic valve stenosis, asymptomatic. Encouraged adequate hydration. ED precautions discussed. No medication changes at this time. Discussed repeating echo for this visit, pt declines. Requests that if she becomes symptomatic, then will consider repeating Echo.    HTN BP above goal. Goal SBP < 140.  Continue current medication regimen. Discussed to log and monitor BP and update us  in 1-2 weeks via MyChart and when to contact office.  Discussed to monitor BP at home at least 2 hours after medications and sitting for 5-10 minutes.  GDMT limited by kidney disease-see below.  Heart heathy diet encouraged.   CKD stage IV Continue follow-up with nephrology as scheduled. Appreciate recs.   Mixed HLD Most recent LDL 92. Continue Crestor. Heart healthy diet and regular cardiovascular exercise encouraged.   Obesity Weight loss via diet and exercise encouraged. Discussed the impact being overweight would have on cardiovascular risk.  Disposition: Follow up in 6 month(s) with Alvan Carrier, MD or APP.  Signed, Almarie Crate, NP

## 2023-12-24 NOTE — Patient Instructions (Addendum)
 Medication Instructions:  Your physician recommends that you continue on your current medications as directed. Please refer to the Current Medication list given to you today.  Labwork: None   Testing/Procedures: None   Follow-Up: Your physician recommends that you schedule a follow-up appointment in: 6 Months   Any Other Special Instructions Will Be Listed Below (If Applicable).  If you need a refill on your cardiac medications before your next appointment, please call your pharmacy.    (Patient Name)-_____________________________  (MRN)-_________________________     (Physician)-_________________________________   (DATE) -_________ (Blood Pressure)-_________  (Heart Rate)-_________    (DATE) -_________ (Blood Pressure)-_________  (Heart Rate)-_________    (DATE) -_________ (Blood Pressure)-_________  (Heart Rate)-_________   (DATE) -_________ (Blood Pressure)-_________  (Heart Rate)-_________    (DATE) -_________ (Blood Pressure)-_________  (Heart Rate)-_________    (DATE) -_________ (Blood Pressure)-_________  (Heart Rate)-_________   (DATE) -_________ (Blood Pressure)-_________  (Heart Rate)-_________    (DATE) -_________ (Blood Pressure)-_________  (Heart Rate)-_________    (DATE) -_________ (Blood Pressure)-_________  (Heart Rate)-_________    (DATE) -_________ (Blood Pressure)-_________  (Heart Rate)-_________    (DATE) -_________ (Blood Pressure)-_________  (Heart Rate)-_________    (DATE) -_________ (Blood Pressure)-_________  (Heart Rate)-_________    (DATE) -_________ (Blood Pressure)-_________  (Heart Rate)-_________    (DATE) -_________ (Blood Pressure)-_________  (Heart Rate)-_________

## 2024-06-30 ENCOUNTER — Ambulatory Visit: Admitting: Nurse Practitioner

## 2024-09-22 ENCOUNTER — Ambulatory Visit: Admitting: Nurse Practitioner
